# Patient Record
Sex: Male | Born: 1945 | Race: Black or African American | Hispanic: No | Marital: Single | Smoking: Current every day smoker
Health system: Southern US, Community
[De-identification: ages and names within clinical notes are randomized; demographics above are authoritative.]

## PROBLEM LIST (undated history)

## (undated) DIAGNOSIS — K5790 Diverticulosis of intestine, part unspecified, without perforation or abscess without bleeding: Secondary | ICD-10-CM

## (undated) DIAGNOSIS — I10 Essential (primary) hypertension: Secondary | ICD-10-CM

## (undated) DIAGNOSIS — M1990 Unspecified osteoarthritis, unspecified site: Secondary | ICD-10-CM

## (undated) HISTORY — DX: Essential (primary) hypertension: I10

## (undated) HISTORY — PX: COLONOSCOPY: SHX174

---

## 1998-07-02 ENCOUNTER — Encounter: Admission: RE | Admit: 1998-07-02 | Discharge: 1998-07-02 | Payer: Self-pay | Admitting: *Deleted

## 2013-01-09 ENCOUNTER — Telehealth: Payer: Self-pay | Admitting: Internal Medicine

## 2013-01-09 NOTE — Telephone Encounter (Signed)
PT called to see if dr hopper would take him back as a patient since it has been a long time. Also his current insurance is united[employer]-medicare and medicaid. thanks

## 2013-01-09 NOTE — Telephone Encounter (Signed)
Pt notified. Made aware that [facility] was accepting new pt's. Stated he had to check with his insurance and would call back.

## 2013-01-09 NOTE — Telephone Encounter (Signed)
Thank you for considering me as a physician. Unfortunately, [facility] has closed my practice &  I have no option to change this. Other physicians in [place] at various sites are accepting new patients. Until their practices are full; I am not allowed to add new patients.

## 2013-01-09 NOTE — Telephone Encounter (Signed)
Pt states that the last time he had seen Dr. Alwyn Ren was in either 2000 or 2002. Pt states that he went by Kyra Leyland at that time. No record of him in our new system. Please advise.

## 2013-03-28 ENCOUNTER — Encounter: Payer: Self-pay | Admitting: Gastroenterology

## 2013-03-29 ENCOUNTER — Encounter: Payer: Self-pay | Admitting: Gastroenterology

## 2013-05-23 ENCOUNTER — Ambulatory Visit (AMBULATORY_SURGERY_CENTER): Payer: Self-pay | Admitting: *Deleted

## 2013-05-23 VITALS — Ht 68.0 in | Wt 195.0 lb

## 2013-05-23 DIAGNOSIS — Z1211 Encounter for screening for malignant neoplasm of colon: Secondary | ICD-10-CM

## 2013-05-23 MED ORDER — MOVIPREP 100 G PO SOLR: ORAL | Status: DC

## 2013-05-23 NOTE — Progress Notes (Signed)
Patient denies any allergies to eggs or soy. 

## 2013-05-25 ENCOUNTER — Encounter: Payer: Self-pay | Admitting: Gastroenterology

## 2013-06-05 ENCOUNTER — Encounter: Payer: Self-pay | Admitting: Gastroenterology

## 2013-06-07 ENCOUNTER — Encounter: Payer: Self-pay | Admitting: Gastroenterology

## 2013-06-07 ENCOUNTER — Ambulatory Visit (AMBULATORY_SURGERY_CENTER): Payer: 59 | Admitting: Gastroenterology

## 2013-06-07 VITALS — BP 141/84 | HR 73 | Temp 98.1°F | Resp 20 | Ht 68.0 in | Wt 195.0 lb

## 2013-06-07 DIAGNOSIS — Z1211 Encounter for screening for malignant neoplasm of colon: Secondary | ICD-10-CM

## 2013-06-07 DIAGNOSIS — D126 Benign neoplasm of colon, unspecified: Secondary | ICD-10-CM

## 2013-06-07 MED ORDER — SODIUM CHLORIDE 0.9 % IV SOLN: 500.0000 mL | INTRAVENOUS | Status: DC

## 2013-06-07 NOTE — Patient Instructions (Signed)
YOU HAD AN ENDOSCOPIC PROCEDURE TODAY AT THE [facility]: Refer to the procedure report that was given to you for any specific questions about what was found during the examination.  If the procedure report does not answer your questions, please call your gastroenterologist to clarify.  If you requested that your care partner not be given the details of your procedure findings, then the procedure report has been included in a sealed envelope for you to review at your convenience later.  YOU SHOULD EXPECT: Some feelings of bloating in the abdomen. Passage of more gas than usual.  Walking can help get rid of the air that was put into your GI tract during the procedure and reduce the bloating. If you had a lower endoscopy (such as a colonoscopy or flexible sigmoidoscopy) you may notice spotting of blood in your stool or on the toilet paper. If you underwent a bowel prep for your procedure, then you may not have a normal bowel movement for a few days.  DIET: Your first meal following the procedure should be a light meal and then it is ok to progress to your normal diet.  A half-sandwich or bowl of soup is an example of a good first meal.  Heavy or fried foods are harder to digest and may make you feel nauseous or bloated.  Likewise meals heavy in dairy and vegetables can cause extra gas to form and this can also increase the bloating.  Drink plenty of fluids but you should avoid alcoholic beverages for 24 hours.  ACTIVITY: Your care partner should take you home directly after the procedure.  You should plan to take it easy, moving slowly for the rest of the day.  You can resume normal activity the day after the procedure however you should NOT DRIVE or use heavy machinery for 24 hours (because of the sedation medicines used during the test).    SYMPTOMS TO REPORT IMMEDIATELY: A gastroenterologist can be reached at any hour.  During normal business hours, 8:30 AM to 5:00 PM Monday through Friday,  call [phone].  After hours and on weekends, please call the GI answering service at [phone] who will take a message and have the physician on call contact you.   Following lower endoscopy (colonoscopy or flexible sigmoidoscopy):  Excessive amounts of blood in the stool  Significant tenderness or worsening of abdominal pains  Swelling of the abdomen that is new, acute  Fever of 100F or higher    FOLLOW UP: If any biopsies were taken you will be contacted by phone or by letter within the next 1-3 weeks.  Call your gastroenterologist if you have not heard about the biopsies in 3 weeks.  Our staff will call the home number listed on your records the next business day following your procedure to check on you and address any questions or concerns that you may have at that time regarding the information given to you following your procedure. This is a courtesy call and so if there is no answer at the home number and we have not heard from you through the emergency physician on call, we will assume that you have returned to your regular daily activities without incident.  SIGNATURES/CONFIDENTIALITY: You and/or your care partner have signed paperwork which will be entered into your electronic medical record.  These signatures attest to the fact that that the information above on your After Visit Summary has been reviewed and is understood.  Full responsibility of the confidentiality   of this discharge information lies with you and/or your care-partner.    HOLD ASPIRIN AND ANTI INFLAMMATORY PRODUCTS FOR 2 WEEKS   INFORMATION ON POLYPS,DIVERTICULOSIS,HEMORRHOIDS,AND HIGH FIBER DIET GIVEN TO YOU TODAY 

## 2013-06-07 NOTE — Progress Notes (Signed)
Called to room to assist during endoscopic procedure.  Patient ID and intended procedure confirmed with present staff. Received instructions for my participation in the procedure from the performing physician.  

## 2013-06-07 NOTE — Progress Notes (Signed)
Lidocaine-40mg IV prior to Propofol InductionPropofol given over incremental dosages 

## 2013-06-07 NOTE — Progress Notes (Signed)
Patient did not experience any of the following events: a burn prior to discharge; a fall within the facility; wrong site/side/patient/procedure/implant event; or a hospital transfer or hospital admission upon discharge from the facility. (G8907) Patient did not have preoperative order for IV antibiotic SSI prophylaxis. (G8918)  

## 2013-06-07 NOTE — Op Note (Signed)
[facility] [address] [place], [address]   COLONOSCOPY PROCEDURE REPORT PATIENT: Manuel Morales, Manuel Morales  MR#: #[id number] BIRTHDATE: 03-18-1946 , 67  yrs. old GENDER: Male ENDOSCOPIST: Meryl Dare, MD, [facility] REFERRED AO:ZHYQM Concepcion Elk, M.D. PROCEDURE DATE:  06/07/2013 PROCEDURE:   Colonoscopy with snare polypectomy First Screening Colonoscopy - Avg.  risk and is 50 yrs.  old or older Yes.  Prior Negative Screening - Now for repeat screening. N/A  History of Adenoma - Now for follow-up colonoscopy & has been > or = to 3 yrs.  N/A  Polyps Removed Today? Yes. ASA CLASS:   Class II INDICATIONS:average risk screening. MEDICATIONS: MAC sedation, administered by CRNA and propofol (Diprivan) 200mg  IV DESCRIPTION OF PROCEDURE:   After the risks benefits and alternatives of the procedure were thoroughly explained, informed consent was obtained.  A digital rectal exam revealed no abnormalities of the rectum.   The LB [id number]  endoscope was introduced through the anus and advanced to the cecum, which was identified by both the appendix and ileocecal valve. No adverse events experienced.   The quality of the prep was good, using MoviPrep  The instrument was then slowly withdrawn as the colon was fully examined.  COLON FINDINGS: A sessile polyp measuring 7 mm in size was found at the hepatic flexure.  A polypectomy was performed with a cold snare.  The resection was complete and the polyp tissue was completely retrieved.   Two semi-pedunculated polyps measuring 7 mm in size were found in the sigmoid colon.  A polypectomy was performed using snare cautery.  The resection was complete and the polyp tissue was completely retrieved.   Moderate diverticulosis was noted in the descending colon and sigmoid colon.   The colon was otherwise normal.  There was no diverticulosis, inflammation, polyps or cancers unless previously stated.  Retroflexed views revealed internal  hemorrhoids. The time to cecum=1 minutes 58 seconds.  Withdrawal time=11 minutes 00 seconds.  The scope was withdrawn and the procedure completed. COMPLICATIONS: There were no complications. ENDOSCOPIC IMPRESSION: 1.   Sessile polyp measuring 7 mm at the hepatic flexure; polypectomy performed with a cold snare 2.   Two semi-pedunculated polyps measuring 7 mm in the sigmoid colon; polypectomy performed using snare cautery 3.   Moderate diverticulosis was noted in the descending and sigmoid colon 4.   Small internal hemorrhoids RECOMMENDATIONS: 1.  Await pathology results 2.  Hold aspirin, aspirin products, and anti-inflammatory medication for 2 weeks. 3.  High fiber diet with liberal fluid intake. 4.  Repeat colonoscopy in 5 years if polyp(s) adenomatous; otherwise 10 years  eSigned:  Meryl Dare, MD, [facility] 06/07/2013 10:25 AM

## 2013-06-08 ENCOUNTER — Telehealth: Payer: Self-pay

## 2013-06-08 NOTE — Telephone Encounter (Signed)
  Follow up Call-  Call back number 06/07/2013  Post procedure Call Back phone  # [phone]  Permission to leave phone message Yes     Patient questions:  Do you have a fever, pain , or abdominal swelling? no Pain Score  0 *  Have you tolerated food without any problems? yes  Have you been able to return to your normal activities? yes  Do you have any questions about your discharge instructions: Diet   no Medications  no Follow up visit  no  Do you have questions or concerns about your Care? no  Actions: * If pain score is 4 or above: No action needed, pain <4.

## 2013-06-20 ENCOUNTER — Encounter: Payer: Self-pay | Admitting: Gastroenterology

## 2014-08-27 ENCOUNTER — Emergency Department (HOSPITAL_COMMUNITY): Payer: PRIVATE HEALTH INSURANCE

## 2014-08-27 ENCOUNTER — Inpatient Hospital Stay (HOSPITAL_COMMUNITY)
Admission: EM | Admit: 2014-08-27 | Discharge: 2014-09-01 | DRG: 392 | Disposition: A | Discharge status: Home / Self Care | Payer: PRIVATE HEALTH INSURANCE | Attending: Internal Medicine | Admitting: Internal Medicine

## 2014-08-27 ENCOUNTER — Encounter (HOSPITAL_COMMUNITY): Payer: Self-pay | Admitting: Emergency Medicine

## 2014-08-27 DIAGNOSIS — M1990 Unspecified osteoarthritis, unspecified site: Secondary | ICD-10-CM | POA: Diagnosis present

## 2014-08-27 DIAGNOSIS — K635 Polyp of colon: Secondary | ICD-10-CM | POA: Diagnosis present

## 2014-08-27 DIAGNOSIS — Z818 Family history of other mental and behavioral disorders: Secondary | ICD-10-CM | POA: Diagnosis not present

## 2014-08-27 DIAGNOSIS — K5669 Other intestinal obstruction: Secondary | ICD-10-CM

## 2014-08-27 DIAGNOSIS — Z880 Allergy status to penicillin: Secondary | ICD-10-CM

## 2014-08-27 DIAGNOSIS — K5660 Unspecified intestinal obstruction: Secondary | ICD-10-CM | POA: Diagnosis present

## 2014-08-27 DIAGNOSIS — F17200 Nicotine dependence, unspecified, uncomplicated: Secondary | ICD-10-CM | POA: Diagnosis present

## 2014-08-27 DIAGNOSIS — K5732 Diverticulitis of large intestine without perforation or abscess without bleeding: Principal | ICD-10-CM | POA: Insufficient documentation

## 2014-08-27 DIAGNOSIS — K56609 Unspecified intestinal obstruction, unspecified as to partial versus complete obstruction: Secondary | ICD-10-CM

## 2014-08-27 DIAGNOSIS — Z720 Tobacco use: Secondary | ICD-10-CM

## 2014-08-27 DIAGNOSIS — K5792 Diverticulitis of intestine, part unspecified, without perforation or abscess without bleeding: Secondary | ICD-10-CM | POA: Diagnosis present

## 2014-08-27 HISTORY — DX: Diverticulosis of intestine, part unspecified, without perforation or abscess without bleeding: K57.90

## 2014-08-27 HISTORY — DX: Unspecified osteoarthritis, unspecified site: M19.90

## 2014-08-27 LAB — CBC WITH DIFFERENTIAL/PLATELET
Basophils Absolute: 0 10*3/uL (ref 0.0–0.1)
Basophils Relative: 0 % (ref 0–1)
Eosinophils Absolute: 0 10*3/uL (ref 0.0–0.7)
Eosinophils Relative: 0 % (ref 0–5)
HCT: 41.7 % (ref 39.0–52.0)
Hemoglobin: 14.8 g/dL (ref 13.0–17.0)
Lymphocytes Relative: 9 % — ABNORMAL LOW (ref 12–46)
Lymphs Abs: 1.5 10*3/uL (ref 0.7–4.0)
MCH: 34.6 pg — ABNORMAL HIGH (ref 26.0–34.0)
MCHC: 35.5 g/dL (ref 30.0–36.0)
MCV: 97.4 fL (ref 78.0–100.0)
Monocytes Absolute: 0.8 10*3/uL (ref 0.1–1.0)
Monocytes Relative: 5 % (ref 3–12)
Neutro Abs: 15 10*3/uL — ABNORMAL HIGH (ref 1.7–7.7)
Neutrophils Relative %: 86 % — ABNORMAL HIGH (ref 43–77)
Platelets: 228 10*3/uL (ref 150–400)
RBC: 4.28 MIL/uL (ref 4.22–5.81)
RDW: 15 % (ref 11.5–15.5)
WBC: 17.4 10*3/uL — ABNORMAL HIGH (ref 4.0–10.5)

## 2014-08-27 LAB — COMPREHENSIVE METABOLIC PANEL
ALT: 14 U/L (ref 0–53)
AST: 21 U/L (ref 0–37)
Albumin: 3.7 g/dL (ref 3.5–5.2)
Alkaline Phosphatase: 58 U/L (ref 39–117)
Anion gap: 11 (ref 5–15)
BUN: 9 mg/dL (ref 6–23)
CO2: 23 mmol/L (ref 19–32)
Calcium: 9 mg/dL (ref 8.4–10.5)
Chloride: 106 mEq/L (ref 96–112)
Creatinine, Ser: 1.27 mg/dL (ref 0.50–1.35)
GFR calc Af Amer: 66 mL/min — ABNORMAL LOW (ref >90)
GFR calc non Af Amer: 57 mL/min — ABNORMAL LOW (ref >90)
Glucose, Bld: 124 mg/dL — ABNORMAL HIGH (ref 70–99)
Potassium: 4 mmol/L (ref 3.5–5.1)
Sodium: 140 mmol/L (ref 135–145)
Total Bilirubin: 1 mg/dL (ref 0.3–1.2)
Total Protein: 7.1 g/dL (ref 6.0–8.3)

## 2014-08-27 LAB — URINALYSIS, ROUTINE W REFLEX MICROSCOPIC
Glucose, UA: NEGATIVE mg/dL
Hgb urine dipstick: NEGATIVE
Ketones, ur: 15 mg/dL — AB
Nitrite: NEGATIVE
Protein, ur: 30 mg/dL — AB
Specific Gravity, Urine: 1.025 (ref 1.005–1.030)
Urobilinogen, UA: 0.2 mg/dL (ref 0.0–1.0)
pH: 5.5 (ref 5.0–8.0)

## 2014-08-27 LAB — URINE MICROSCOPIC-ADD ON

## 2014-08-27 LAB — I-STAT CG4 LACTIC ACID, ED: Lactic Acid, Venous: 1.52 mmol/L (ref 0.5–2.2)

## 2014-08-27 LAB — LIPASE, BLOOD: Lipase: 20 U/L (ref 11–59)

## 2014-08-27 MED ORDER — CIPROFLOXACIN IN D5W 400 MG/200ML IV SOLN
400.0000 mg | Freq: Once | INTRAVENOUS | Status: AC
  Administered 2014-08-27: 400 mg via INTRAVENOUS
  Filled 2014-08-27: qty 200

## 2014-08-27 MED ORDER — ENOXAPARIN SODIUM 30 MG/0.3ML ~~LOC~~ SOLN
30.0000 mg | SUBCUTANEOUS | Status: DC
  Administered 2014-08-28 – 2014-08-30 (×3): 30 mg via SUBCUTANEOUS
  Filled 2014-08-27 (×4): qty 0.3

## 2014-08-27 MED ORDER — SODIUM CHLORIDE 0.9 % IV SOLN
INTRAVENOUS | Status: DC
  Administered 2014-08-27: 16:00:00 via INTRAVENOUS

## 2014-08-27 MED ORDER — MORPHINE SULFATE 4 MG/ML IJ SOLN
4.0000 mg | Freq: Once | INTRAMUSCULAR | Status: AC
  Administered 2014-08-27: 4 mg via INTRAVENOUS
  Filled 2014-08-27: qty 1

## 2014-08-27 MED ORDER — IOHEXOL 300 MG/ML  SOLN
100.0000 mL | Freq: Once | INTRAMUSCULAR | Status: AC | PRN
  Administered 2014-08-27: 100 mL via INTRAVENOUS

## 2014-08-27 MED ORDER — HYDROCODONE-ACETAMINOPHEN 5-325 MG PO TABS: 1.0000 | ORAL_TABLET | ORAL | Status: DC | PRN

## 2014-08-27 MED ORDER — MORPHINE SULFATE 4 MG/ML IJ SOLN: 4.0000 mg | INTRAMUSCULAR | Status: DC | PRN

## 2014-08-27 MED ORDER — HYDROMORPHONE HCL 1 MG/ML IJ SOLN: 1.0000 mg | INTRAMUSCULAR | Status: DC | PRN

## 2014-08-27 MED ORDER — ACETAMINOPHEN 325 MG PO TABS
650.0000 mg | ORAL_TABLET | Freq: Four times a day (QID) | ORAL | Status: DC | PRN
  Administered 2014-08-27 – 2014-08-28 (×2): 650 mg via ORAL
  Filled 2014-08-27 (×2): qty 2

## 2014-08-27 MED ORDER — SODIUM CHLORIDE 0.9 % IV BOLUS (SEPSIS)
1000.0000 mL | Freq: Once | INTRAVENOUS | Status: AC
  Administered 2014-08-27: 1000 mL via INTRAVENOUS

## 2014-08-27 MED ORDER — ONDANSETRON HCL 4 MG/2ML IJ SOLN: 4.0000 mg | Freq: Four times a day (QID) | INTRAMUSCULAR | Status: DC | PRN

## 2014-08-27 MED ORDER — IOHEXOL 300 MG/ML  SOLN
25.0000 mL | Freq: Once | INTRAMUSCULAR | Status: AC | PRN
  Administered 2014-08-27: 25 mL via ORAL

## 2014-08-27 MED ORDER — ACETAMINOPHEN 650 MG RE SUPP: 650.0000 mg | Freq: Four times a day (QID) | RECTAL | Status: DC | PRN

## 2014-08-27 MED ORDER — ENOXAPARIN SODIUM 40 MG/0.4ML ~~LOC~~ SOLN
40.0000 mg | SUBCUTANEOUS | Status: DC
Start: 2014-08-27 — End: 2014-08-27
  Administered 2014-08-27: 40 mg via SUBCUTANEOUS
  Filled 2014-08-27: qty 0.4

## 2014-08-27 MED ORDER — METRONIDAZOLE IN NACL 5-0.79 MG/ML-% IV SOLN
500.0000 mg | Freq: Once | INTRAVENOUS | Status: AC
  Administered 2014-08-27: 500 mg via INTRAVENOUS
  Filled 2014-08-27: qty 100

## 2014-08-27 MED ORDER — ONDANSETRON HCL 4 MG PO TABS: 4.0000 mg | ORAL_TABLET | Freq: Four times a day (QID) | ORAL | Status: DC | PRN

## 2014-08-27 MED ORDER — ONDANSETRON HCL 4 MG/2ML IJ SOLN
4.0000 mg | Freq: Once | INTRAMUSCULAR | Status: AC
  Administered 2014-08-27: 4 mg via INTRAVENOUS
  Filled 2014-08-27: qty 2

## 2014-08-27 MED ORDER — ALBUTEROL SULFATE (2.5 MG/3ML) 0.083% IN NEBU: 2.5000 mg | INHALATION_SOLUTION | RESPIRATORY_TRACT | Status: DC | PRN

## 2014-08-27 MED ORDER — POTASSIUM CHLORIDE IN NACL 20-0.9 MEQ/L-% IV SOLN
INTRAVENOUS | Status: AC
  Administered 2014-08-27 – 2014-08-28 (×2): via INTRAVENOUS
  Filled 2014-08-27 (×3): qty 1000

## 2014-08-27 MED ORDER — CIPROFLOXACIN IN D5W 400 MG/200ML IV SOLN
400.0000 mg | Freq: Two times a day (BID) | INTRAVENOUS | Status: DC
  Administered 2014-08-27 – 2014-08-31 (×8): 400 mg via INTRAVENOUS
  Filled 2014-08-27 (×10): qty 200

## 2014-08-27 MED ORDER — METRONIDAZOLE IN NACL 5-0.79 MG/ML-% IV SOLN
500.0000 mg | Freq: Three times a day (TID) | INTRAVENOUS | Status: DC
  Administered 2014-08-27 – 2014-08-31 (×12): 500 mg via INTRAVENOUS
  Filled 2014-08-27 (×13): qty 100

## 2014-08-27 NOTE — ED Notes (Signed)
Patient transported to CT 

## 2014-08-27 NOTE — H&P (Signed)
History and Physical  Wadell Craddock HUD:[id number] DOB: 12/25/1945 DOA: 08/27/2014  Referring physician: Dalia Heading, ED-PA PCP: Philis Fendt, MD  Outpatient Specialists:  1. Not known  Chief Complaint: Abdominal pain.  HPI: Manuel Morales is a 68 y.o. male with history of arthritis and no other significant past medical history, underwent colonoscopy by Dr. Lucio Edward in August 2015 which apparently showed a benign polyp and diverticulosis. He was in his usual state of health until 2-3 days ago and started experiencing left lower quadrant abdominal pain after eating some peanuts. He took a laxative with good BM but continued to feel bloated. The pain then gradually got worse. He rates the pain as 3-4/10 in severity, nonradiating, made worse with movement, not associated with nausea, vomiting or chills. He states that he had a fever off 102F at home. Had a normal BM today. Due to persisting symptoms, he presented to the ED where CT abdomen showed left lower colon Diverticulitis and SBO. Hospitalist admission was requested.  Review of Systems: All systems reviewed and apart from history of presenting illness, are negative.  History reviewed. No pertinent past medical history. Past Surgical History  Procedure Laterality Date  . Colonoscopy  ?1999   Social History:  reports that he has been smoking Cigarettes.  He has a 15 pack-year smoking history. He has never used smokeless tobacco. He reports that he does not drink alcohol or use illicit drugs. Single. Patient's mother lives with him. Independent of activities of daily living.  Allergies  Allergen Reactions  . Penicillins Hives    Family History  Problem Relation Age of Onset  . Colon cancer Neg Hx   . Dementia Mother     Prior to Admission medications   Medication Sig Start Date End Date Taking? Authorizing Provider  ibuprofen (ADVIL,MOTRIN) 400 MG tablet Take 400 mg by mouth every 6 (six) hours as needed  for mild pain or moderate pain.   Yes Historical Provider, MD   Physical Exam: Filed Vitals:   08/27/14 1230 08/27/14 1244 08/27/14 1424 08/27/14 1622  BP: 121/67  132/71 116/76  Pulse: 84  79 87  Temp:  100.4 F (38 C) 98.7 F (37.1 C) 100.8 F (38.2 C)  TempSrc:  Rectal Oral Oral  Resp: 21  26 24   Height:    5\' 8"  (1.727 m)  Weight:    39.145 kg (86 lb 4.8 oz)  SpO2: 96%  93% 100%     General exam: Moderately built and nourished pleasant middle-aged male patient, lying comfortably supine on the gurney in no obvious distress.  Head, eyes and ENT: Nontraumatic and normocephalic. Pupils equally reacting to light and accommodation. Oral mucosa moist.  Neck: Supple. No JVD, carotid bruit or thyromegaly.  Lymphatics: No lymphadenopathy.  Respiratory system: Clear to auscultation. No increased work of breathing.  Cardiovascular system: S1 and S2 heard, RRR. No JVD, murmurs, gallops, clicks or pedal edema.  Gastrointestinal system: Abdomen is nondistended, soft. Tenderness in the left lower quadrant with minimal guarding but no rigidity or rebound. Normal bowel sounds heard. No organomegaly or masses appreciated.  Central nervous system: Alert and oriented. No focal neurological deficits.  Extremities: Symmetric 5 x 5 power. Peripheral pulses symmetrically felt.   Skin: No rashes or acute findings.  Musculoskeletal system: Negative exam.  Psychiatry: Pleasant and cooperative.   Labs on Admission:  Basic Metabolic Panel:  Recent Labs Lab 08/27/14 1024  NA 140  K 4.0  CL 106  CO2 23  GLUCOSE 124*  BUN 9  CREATININE 1.27  CALCIUM 9.0   Liver Function Tests:  Recent Labs Lab 08/27/14 1024  AST 21  ALT 14  ALKPHOS 58  BILITOT 1.0  PROT 7.1  ALBUMIN 3.7    Recent Labs Lab 08/27/14 1024  LIPASE 20   No results for input(s): AMMONIA in the last 168 hours. CBC:  Recent Labs Lab 08/27/14 0940  WBC 17.4*  NEUTROABS 15.0*  HGB 14.8  HCT 41.7  MCV  97.4  PLT 228   Cardiac Enzymes: No results for input(s): CKTOTAL, CKMB, CKMBINDEX, TROPONINI in the last 168 hours.  BNP (last 3 results) No results for input(s): PROBNP in the last 8760 hours. CBG: No results for input(s): GLUCAP in the last 168 hours.  Radiological Exams on Admission: Ct Abdomen Pelvis W Contrast  08/27/2014   CLINICAL DATA:  Left abdominal pain with fever  EXAM: CT ABDOMEN AND PELVIS WITH CONTRAST  TECHNIQUE: Multidetector CT imaging of the abdomen and pelvis was performed using the standard protocol following bolus administration of intravenous contrast.  CONTRAST:  163mL OMNIPAQUE IOHEXOL 300 MG/ML  SOLN  COMPARISON:  None.  FINDINGS: Mild dependent atelectasis in the lung bases.  Fatty infiltration of the liver. No focal liver mass lesion. Spleen is normal. Pancreas is normal. Small layering gallstones. Gallbladder wall not thickened.  Kidneys are normal. No renal obstruction, mass, or calculi. Urinary bladder is empty but appears normal.  Diverticular change in the sigmoid colon and lower left colon. Focal area of thickening of the colon with stranding in the pericolonic fat. This is located at the junction of the left colon and sigmoid colon. Adjacent to this loop of colon is a thickened small bowel loop with diffuse mucosal edema. This is felt to be secondarily inflamed from the colonic edema. Diverticulitis appears most likely diagnosis. No abscess. No free air or free fluid.  The jejunum is mildly distended with air-fluid levels proximally suggesting partial small bowel obstruction due to the thickened loop of jejunum.  IMPRESSION: Thickened segment of lower left colon due to diverticulitis. There is stranding in the pericolonic fat as well as a thickened loop of adjacent small bowel felt to be secondarily inflamed in causing mild partial small bowel obstruction. No abscess.  Fatty liver  Cholelithiasis.   Electronically Signed   By: Franchot Gallo M.D.   On: 08/27/2014  13:38    EKG: No EKG seen in Epic.  Assessment/Plan Principal Problem:   Diverticulitis Active Problems:   SBO (small bowel obstruction)   Tobacco use disorder   1. Acute diverticulitis: Admit to medical floor. Bowel rest by nothing by mouth except meds, IV fluids and IV antibiotics-Cipro and Flagyl. Recently had colonoscopy in August 2015. 2. Mild partial small bowel obstruction: Likely secondary to inflamed bowel loops from problem #1. Bowel rest, IV fluids and reevaluate in a.m. with KUB and clinically. 3. Tobacco abuse: Cessation counseled. 4. Leukocytosis: Secondary to problem #1. Follow CBCs     Code Status: Full  Family Communication: None at bedside  Disposition Plan: Home when medically stable.   Time spent: 27 minutes  Ayanah Snader, MD, FACP, FHM. [facility] Pager [phone]  If 7PM-7AM, please contact night-coverage [email or web address] Password [email or web address] 08/27/2014, 5:22 PM

## 2014-08-27 NOTE — ED Notes (Signed)
Pt arrived with brother to ED. Pt stated that he has been having left lower abdominal pain x 2 days. Denies any trouble with bowels and denies blood in stool. Has been having fevers as well as high as 102F.

## 2014-08-27 NOTE — Progress Notes (Signed)
Arrived to 5w04 , ambulated to bed from hallway-gait steady. Denies nausea/pain at this time, instructed on usage of call lights and room surroundings.

## 2014-08-27 NOTE — Progress Notes (Signed)
Dr. Algis Liming notified of arrival to unit

## 2014-08-27 NOTE — ED Provider Notes (Signed)
CSN: [id number]     Arrival date & time 08/27/14  6962 History   First MD Initiated Contact with Patient 08/27/14 [phone]     Chief Complaint  Patient presents with  . Abdominal Pain     (Consider location/radiation/quality/duration/timing/severity/associated sxs/prior Treatment) HPI Patient presents to the emergency department with left-sided abdominal pain that started 2 days ago.  The patient states that she has also had fever associated with the abdominal pain.  The patient denies chest pain, shortness of breath, back pain, dysuria, hematuria, bloody stool, nausea, vomiting, diarrhea, weakness, dizziness, headache, blurred vision, scrotal pain, rash, lightheadedness, near syncope or syncope.  The patient states that he took a pain pill to see if it would relieve his symptoms, but did not other than just putting him to sleep.  Patient states that movement and palpation make the pain worse History reviewed. No pertinent past medical history. Past Surgical History  Procedure Laterality Date  . Colonoscopy  ?1999   Family History  Problem Relation Age of Onset  . Colon cancer Neg Hx   . Dementia Mother    History  Substance Use Topics  . Smoking status: Current Every Day Smoker -- 0.50 packs/day for 30 years    Types: Cigarettes  . Smokeless tobacco: Never Used  . Alcohol Use: No    Review of Systems All other systems negative except as documented in the HPI. All pertinent positives and negatives as reviewed in the HPI.ray    Allergies  Penicillins  Home Medications   Prior to Admission medications   Not on File   BP 125/89 mmHg  Pulse 95  Temp(Src) 100.2 F (37.9 C) (Oral)  Resp 24  SpO2 95% Physical Exam  Constitutional: He is oriented to person, place, and time. He appears well-developed and well-nourished. No distress.  HENT:  Head: Normocephalic and atraumatic.  Mouth/Throat: Oropharynx is clear and moist.  Eyes: Pupils are equal, round, and reactive to light.   Neck: Normal range of motion. Neck supple.  Cardiovascular: Regular rhythm and normal heart sounds.  Tachycardia present.  Exam reveals no gallop and no friction rub.   No murmur heard. Pulmonary/Chest: Effort normal and breath sounds normal. No respiratory distress.  Abdominal: Soft. Normal appearance and bowel sounds are normal. There is tenderness in the left upper quadrant and left lower quadrant. There is guarding. There is no rigidity, no rebound and no CVA tenderness. No hernia.    Musculoskeletal: He exhibits no edema.  Neurological: He is alert and oriented to person, place, and time. He exhibits normal muscle tone. Coordination normal.  Skin: Skin is warm and dry. No rash noted. No erythema.  Psychiatric: He has a normal mood and affect. His behavior is normal.  Nursing note and vitals reviewed.   ED Course  Procedures (including critical care time) Labs Review Labs Reviewed  URINALYSIS, ROUTINE W REFLEX MICROSCOPIC - Abnormal; Notable for the following:    Color, Urine AMBER (*)    APPearance HAZY (*)    Bilirubin Urine SMALL (*)    Ketones, ur 15 (*)    Protein, ur 30 (*)    Leukocytes, UA SMALL (*)    All other components within normal limits  CBC WITH DIFFERENTIAL - Abnormal; Notable for the following:    WBC 17.4 (*)    MCH 34.6 (*)    Neutrophils Relative % 86 (*)    Neutro Abs 15.0 (*)    Lymphocytes Relative 9 (*)    All other components  within normal limits  COMPREHENSIVE METABOLIC PANEL - Abnormal; Notable for the following:    Glucose, Bld 124 (*)    GFR calc non Af Amer 57 (*)    GFR calc Af Amer 66 (*)    All other components within normal limits  URINE MICROSCOPIC-ADD ON - Abnormal; Notable for the following:    Squamous Epithelial / LPF FEW (*)    Bacteria, UA FEW (*)    Casts HYALINE CASTS (*)    All other components within normal limits  LIPASE, BLOOD  I-STAT CG4 LACTIC ACID, ED    Imaging Review Ct Abdomen Pelvis W Contrast  08/27/2014    CLINICAL DATA:  Left abdominal pain with fever  EXAM: CT ABDOMEN AND PELVIS WITH CONTRAST  TECHNIQUE: Multidetector CT imaging of the abdomen and pelvis was performed using the standard protocol following bolus administration of intravenous contrast.  CONTRAST:  156mL OMNIPAQUE IOHEXOL 300 MG/ML  SOLN  COMPARISON:  None.  FINDINGS: Mild dependent atelectasis in the lung bases.  Fatty infiltration of the liver. No focal liver mass lesion. Spleen is normal. Pancreas is normal. Small layering gallstones. Gallbladder wall not thickened.  Kidneys are normal. No renal obstruction, mass, or calculi. Urinary bladder is empty but appears normal.  Diverticular change in the sigmoid colon and lower left colon. Focal area of thickening of the colon with stranding in the pericolonic fat. This is located at the junction of the left colon and sigmoid colon. Adjacent to this loop of colon is a thickened small bowel loop with diffuse mucosal edema. This is felt to be secondarily inflamed from the colonic edema. Diverticulitis appears most likely diagnosis. No abscess. No free air or free fluid.  The jejunum is mildly distended with air-fluid levels proximally suggesting partial small bowel obstruction due to the thickened loop of jejunum.  IMPRESSION: Thickened segment of lower left colon due to diverticulitis. There is stranding in the pericolonic fat as well as a thickened loop of adjacent small bowel felt to be secondarily inflamed in causing mild partial small bowel obstruction. No abscess.  Fatty liver  Cholelithiasis.   Electronically Signed   By: Franchot Gallo M.D.   On: 08/27/2014 13:38   9:15 AM Patient is advised of the expected course here in the emergency department and the testing we will be ordered.  I also advised him that he cannot have anything to eat or drink until after her scan results and our blood tests are resulted.  11 AM I rechecked the patient and he is feeling improved pain at this time.  He is  awaiting further test results and CT scan and he is made aware of this. QUESTION were answered   2:15 PM patient is advised of the CT scan results and he will be admitted to the hospital for further evaluation and care MDM   Final diagnoses:  Diverticulitis of large intestine without perforation or abscess without bleeding        Brent General, PA-C 08/27/14 1501  Shaune Pollack, MD 09/02/14 1150

## 2014-08-28 ENCOUNTER — Inpatient Hospital Stay (HOSPITAL_COMMUNITY): Payer: PRIVATE HEALTH INSURANCE

## 2014-08-28 DIAGNOSIS — D72829 Elevated white blood cell count, unspecified: Secondary | ICD-10-CM

## 2014-08-28 DIAGNOSIS — K5732 Diverticulitis of large intestine without perforation or abscess without bleeding: Principal | ICD-10-CM

## 2014-08-28 LAB — BASIC METABOLIC PANEL
ANION GAP: 10 (ref 5–15)
BUN: 8 mg/dL (ref 6–23)
CHLORIDE: 103 meq/L (ref 96–112)
CO2: 22 mmol/L (ref 19–32)
CREATININE: 1.05 mg/dL (ref 0.50–1.35)
Calcium: 8.5 mg/dL (ref 8.4–10.5)
GFR calc Af Amer: 83 mL/min — ABNORMAL LOW (ref >90)
GFR calc non Af Amer: 72 mL/min — ABNORMAL LOW (ref >90)
Glucose, Bld: 110 mg/dL — ABNORMAL HIGH (ref 70–99)
Potassium: 4 mmol/L (ref 3.5–5.1)
Sodium: 135 mmol/L (ref 135–145)

## 2014-08-28 LAB — CBC
HEMATOCRIT: 38.7 % — AB (ref 39.0–52.0)
HEMOGLOBIN: 13.4 g/dL (ref 13.0–17.0)
MCH: 33.8 pg (ref 26.0–34.0)
MCHC: 34.6 g/dL (ref 30.0–36.0)
MCV: 97.7 fL (ref 78.0–100.0)
Platelets: 218 10*3/uL (ref 150–400)
RBC: 3.96 MIL/uL — AB (ref 4.22–5.81)
RDW: 15.1 % (ref 11.5–15.5)
WBC: 11.6 10*3/uL — ABNORMAL HIGH (ref 4.0–10.5)

## 2014-08-28 NOTE — Progress Notes (Signed)
Utilization review completed. Nikhita Mentzel, RN, BSN. 

## 2014-08-28 NOTE — Progress Notes (Signed)
PROGRESS NOTE    Manuel Morales WUJ:[id number] DOB: 04-04-1946 DOA: 08/27/2014 PCP: Philis Fendt, MD  HPI/Brief narrative 68 year old male with no significant past medical history, admitted with left lower colon Acute diverticulitis and PSBO. Recently underwent colonoscopy in August 2015 which apparently showed benign polyp and diverticulosis.  Assessment/Plan:   1. Acute diverticulitis: Bowel rest by nothing by mouth except meds, IV fluids and IV antibiotics-Cipro and Flagyl. Recently had colonoscopy in August 2015. Improving. 2. Mild partial small bowel obstruction: Likely secondary to inflamed bowel loops from problem #1. Bowel rest, IV fluids and reevaluate in a.m. with KUB and clinically. KUB shows persistent ileus versus PSBO. Continue nothing by mouth for now. Will mobilize. 3. Tobacco abuse: Cessation counseled. 4. Leukocytosis: Secondary to problem #1. Follow CBCs. Improving   Code Status: Full Family Communication: None at bedside Disposition Plan: Home when medically stable   Consultants:  None  Procedures:  None  Antibiotics:  IV Cipro 12/22 >  IV Flagyl 12/22 >   Subjective: Feels better. Left lower quadrant pain almost resolved just feels sore. Passing flatus. No BM. No nausea or vomiting.  Objective: Filed Vitals:   08/27/14 1424 08/27/14 1622 08/27/14 2103 08/28/14 0544  BP: 132/71 116/76 111/75 101/62  Pulse: 79 87 96 85  Temp: 98.7 F (37.1 C) 100.8 F (38.2 C) 98.4 F (36.9 C) 99.3 F (37.4 C)  TempSrc: Oral Oral Oral Oral  Resp: 26 24 21 22   Height:  5\' 8"  (1.727 m)  5\' 8"  (1.727 m)  Weight:    87.227 kg (192 lb 4.8 oz)  SpO2: 93% 100% 100% 94%    Intake/Output Summary (Last 24 hours) at 08/28/14 1159 Last data filed at 08/28/14 0547  Gross per 24 hour  Intake      0 ml  Output    375 ml  Net   -375 ml   Filed Weights   08/28/14 0544  Weight: 87.227 kg (192 lb 4.8 oz)     Exam:  General exam: Pleasant middle-aged  male lying comfortably in bed. Respiratory system: Clear. No increased work of breathing. Cardiovascular system: S1 & S2 heard, RRR. No JVD, murmurs, gallops, clicks or pedal edema. Gastrointestinal system: Abdomen is nondistended, soft. Mild left lower quadrant tenderness to deep palpation without peritoneal signs. Normal bowel sounds heard. Central nervous system: Alert and oriented. No focal neurological deficits. Extremities: Symmetric 5 x 5 power.   Data Reviewed: Basic Metabolic Panel:  Recent Labs Lab 08/27/14 1024 08/28/14 0635  NA 140 135  K 4.0 4.0  CL 106 103  CO2 23 22  GLUCOSE 124* 110*  BUN 9 8  CREATININE 1.27 1.05  CALCIUM 9.0 8.5   Liver Function Tests:  Recent Labs Lab 08/27/14 1024  AST 21  ALT 14  ALKPHOS 58  BILITOT 1.0  PROT 7.1  ALBUMIN 3.7    Recent Labs Lab 08/27/14 1024  LIPASE 20   No results for input(s): AMMONIA in the last 168 hours. CBC:  Recent Labs Lab 08/27/14 0940 08/28/14 0635  WBC 17.4* 11.6*  NEUTROABS 15.0*  --   HGB 14.8 13.4  HCT 41.7 38.7*  MCV 97.4 97.7  PLT 228 218   Cardiac Enzymes: No results for input(s): CKTOTAL, CKMB, CKMBINDEX, TROPONINI in the last 168 hours. BNP (last 3 results) No results for input(s): PROBNP in the last 8760 hours. CBG: No results for input(s): GLUCAP in the last 168 hours.  No results found for this or any previous visit (  from the past 240 hour(s)).         Studies: Abd 1 View (kub)  08/28/2014   CLINICAL DATA:  Bowel obstruction  EXAM: ABDOMEN - 1 VIEW  COMPARISON:  CT abdomen and pelvis August 27, 2014  FINDINGS: There is contrast in the colon. There remain multiple loops of dilated small bowel. No free air is seen on this supine examination.  IMPRESSION: Dilated loops of small bowel. Question ileus versus a degree of partial obstruction. Contrast does reach the colon. No free air.   Electronically Signed   By: Lowella Grip M.D.   On: 08/28/2014 08:10   Ct  Abdomen Pelvis W Contrast  08/27/2014   CLINICAL DATA:  Left abdominal pain with fever  EXAM: CT ABDOMEN AND PELVIS WITH CONTRAST  TECHNIQUE: Multidetector CT imaging of the abdomen and pelvis was performed using the standard protocol following bolus administration of intravenous contrast.  CONTRAST:  142mL OMNIPAQUE IOHEXOL 300 MG/ML  SOLN  COMPARISON:  None.  FINDINGS: Mild dependent atelectasis in the lung bases.  Fatty infiltration of the liver. No focal liver mass lesion. Spleen is normal. Pancreas is normal. Small layering gallstones. Gallbladder wall not thickened.  Kidneys are normal. No renal obstruction, mass, or calculi. Urinary bladder is empty but appears normal.  Diverticular change in the sigmoid colon and lower left colon. Focal area of thickening of the colon with stranding in the pericolonic fat. This is located at the junction of the left colon and sigmoid colon. Adjacent to this loop of colon is a thickened small bowel loop with diffuse mucosal edema. This is felt to be secondarily inflamed from the colonic edema. Diverticulitis appears most likely diagnosis. No abscess. No free air or free fluid.  The jejunum is mildly distended with air-fluid levels proximally suggesting partial small bowel obstruction due to the thickened loop of jejunum.  IMPRESSION: Thickened segment of lower left colon due to diverticulitis. There is stranding in the pericolonic fat as well as a thickened loop of adjacent small bowel felt to be secondarily inflamed in causing mild partial small bowel obstruction. No abscess.  Fatty liver  Cholelithiasis.   Electronically Signed   By: Franchot Gallo M.D.   On: 08/27/2014 13:38        Scheduled Meds: . ciprofloxacin  400 mg Intravenous BID  . enoxaparin (LOVENOX) injection  30 mg Subcutaneous Q24H  . metronidazole  500 mg Intravenous Q8H   Continuous Infusions:   Principal Problem:   Diverticulitis Active Problems:   SBO (small bowel obstruction)   Tobacco  use disorder    Time spent: 34 minutes    Abbigale Mcelhaney, MD, FACP, FHM. [facility] Pager [phone]  If 7PM-7AM, please contact night-coverage [email or web address] Password [email or web address] 08/28/2014, 11:59 AM    LOS: 1 day

## 2014-08-28 NOTE — Progress Notes (Signed)
ARRIVED TO ROOM 5W03 FROM 3s16, ORIENTED TO ROOM AND SURROUNDINGS, DENIES PAIN/NAUSEA AT THIS TIME.

## 2014-08-29 ENCOUNTER — Inpatient Hospital Stay (HOSPITAL_COMMUNITY): Payer: PRIVATE HEALTH INSURANCE

## 2014-08-29 DIAGNOSIS — K5732 Diverticulitis of large intestine without perforation or abscess without bleeding: Secondary | ICD-10-CM | POA: Insufficient documentation

## 2014-08-29 NOTE — Progress Notes (Signed)
PROGRESS NOTE    Manuel Morales TGG:[id number] DOB: [birth year] DOA: 08/27/2014 PCP: Philis Fendt, MD  HPI/Brief narrative 68 year old male with no significant past medical history, admitted with left lower colon Acute diverticulitis and PSBO. Recently underwent colonoscopy in August 2015 which apparently showed benign polyp and diverticulosis.  Assessment/Plan:   1. Acute diverticulitis: Bowel rest by nothing by mouth except meds, IV fluids and IV antibiotics-Cipro and Flagyl. Recently had colonoscopy in August 2015. Improving. Advance to clear liquid diet and tolerating. 2. Mild partial small bowel obstruction: Likely secondary to inflamed bowel loops from problem #1. Treated conservatively with bowel rest and IV fluids. Patient was started on clear liquids 12/23 PM. He has had several watery BM's. KUB shows persisting SBO. Surgery consultation appreciated and indicate that this is due to adjacent small bowel loop that was inflamed and recommend continued clear liquids for now.  3. Tobacco abuse: Cessation counseled. 4. Leukocytosis: Secondary to problem #1. Follow CBCs. Improving   Code Status: Full Family Communication: None at bedside Disposition Plan: Home when medically stable   Consultants:  General surgery  Procedures:  None  Antibiotics:  IV Cipro 12/22 >  IV Flagyl 12/22 >   Subjective: Patient had several watery BMs since last night. Minimal LLQ soreness. Tolerating clear liquid diet.  Objective: Filed Vitals:   08/28/14 1409 08/28/14 2236 08/29/14 0617 08/29/14 1255  BP:  155/84 144/88 153/91  Pulse:  93 84 80  Temp: 100.5 F (38.1 C) 99.5 F (37.5 C) 99.9 F (37.7 C) 98.5 F (36.9 C)  TempSrc: Oral Oral Oral Oral  Resp:  20 15 18   Height:      Weight:   87 kg (191 lb 12.8 oz)   SpO2:  99% 97% 98%    Intake/Output Summary (Last 24 hours) at 08/29/14 1528 Last data filed at 08/29/14 1400  Gross per 24 hour  Intake    500 ml  Output       3 ml  Net    497 ml   Filed Weights   08/28/14 0544 08/29/14 0617  Weight: 87.227 kg (192 lb 4.8 oz) 87 kg (191 lb 12.8 oz)     Exam:  General exam: Pleasant middle-aged male lying comfortably in bed. Respiratory system: Clear. No increased work of breathing. Cardiovascular system: S1 & S2 heard, RRR. No JVD, murmurs, gallops, clicks or pedal edema. Gastrointestinal system: Abdomen is nondistended, soft. Mild left lower quadrant tenderness to deep palpation without peritoneal signs. Normal bowel sounds heard. Central nervous system: Alert and oriented. No focal neurological deficits. Extremities: Symmetric 5 x 5 power.   Data Reviewed: Basic Metabolic Panel:  Recent Labs Lab 08/27/14 1024 08/28/14 0635  NA 140 135  K 4.0 4.0  CL 106 103  CO2 23 22  GLUCOSE 124* 110*  BUN 9 8  CREATININE 1.27 1.05  CALCIUM 9.0 8.5   Liver Function Tests:  Recent Labs Lab 08/27/14 1024  AST 21  ALT 14  ALKPHOS 58  BILITOT 1.0  PROT 7.1  ALBUMIN 3.7    Recent Labs Lab 08/27/14 1024  LIPASE 20   No results for input(s): AMMONIA in the last 168 hours. CBC:  Recent Labs Lab 08/27/14 0940 08/28/14 0635  WBC 17.4* 11.6*  NEUTROABS 15.0*  --   HGB 14.8 13.4  HCT 41.7 38.7*  MCV 97.4 97.7  PLT 228 218   Cardiac Enzymes: No results for input(s): CKTOTAL, CKMB, CKMBINDEX, TROPONINI in the last 168 hours. BNP (last 3  results) No results for input(s): PROBNP in the last 8760 hours. CBG: No results for input(s): GLUCAP in the last 168 hours.  No results found for this or any previous visit (from the past 240 hour(s)).         Studies: Abd 1 View (kub)  08/28/2014   CLINICAL DATA:  Bowel obstruction  EXAM: ABDOMEN - 1 VIEW  COMPARISON:  CT abdomen and pelvis August 27, 2014  FINDINGS: There is contrast in the colon. There remain multiple loops of dilated small bowel. No free air is seen on this supine examination.  IMPRESSION: Dilated loops of small bowel.  Question ileus versus a degree of partial obstruction. Contrast does reach the colon. No free air.   Electronically Signed   By: Lowella Grip M.D.   On: 08/28/2014 08:10   Dg Abd 2 Views  08/29/2014   CLINICAL DATA:  Bowel obstruction.  Pain for 2 days  EXAM: ABDOMEN - 2 VIEW  COMPARISON:  August 28, 2014  FINDINGS: Supine and upright images were obtained. Contrast is noted in the colon. There remain multiple loops of dilated small bowel with multiple air-fluid levels. No free air. No abnormal calcifications.  IMPRESSION: Dilated is loops of small bowel with multiple air-fluid levels. Question ileus versus a degree of partial bowel obstruction. No free air.   Electronically Signed   By: Lowella Grip M.D.   On: 08/29/2014 09:56        Scheduled Meds: . ciprofloxacin  400 mg Intravenous BID  . enoxaparin (LOVENOX) injection  30 mg Subcutaneous Q24H  . metronidazole  500 mg Intravenous Q8H   Continuous Infusions:   Principal Problem:   Diverticulitis Active Problems:   SBO (small bowel obstruction)   Tobacco use disorder    Time spent: 67 minutes    Ilean Spradlin, MD, FACP, FHM. [facility] Pager [phone]  If 7PM-7AM, please contact night-coverage [email or web address] Password [email or web address] 08/29/2014, 3:28 PM    LOS: 2 days

## 2014-08-29 NOTE — Consult Note (Signed)
Reason for Consult:diverticulitis Referring Physician: Dr Francis Gaines is an 68 y.o. male.  HPI: 68 year old African-American male otherwise healthy admitted 2 days ago for diverticulitis. He states he started having pain on Tuesday. He denies any similar symptoms in the past. However he states that he may have had something perhaps that was diverticulitis but he attributed to indigestion. He states that he developed severe abdominal pain on Tuesday. It was mainly in his left lower abdomen. He tried taking a laxative which did make him have a bowel movement but it didn't cause any significant decrease in his pain. Because the pain was intense he came to the emergency room and was found to have an elevated white blood cell count 17,000 and CT evidence of focal diverticulitis with adjacent small loop of small bowel which appear thickened. He denies any weight loss. He denies any melena or hematochezia. He denies any prior surgical intervention. He does smoke about 7 cigarettes a day. He denies any alcohol or drug.  Since admission he states his abdominal pain has gotten aggressively better. He doesn't really have any right now. He reports flatus. He had 4 bowel movements yesterday and one already today. He states when they gave him clear liquids he just felt some indigestion.  Past Medical History  Diagnosis Date  . Arthritis   . Diverticulosis     Past Surgical History  Procedure Laterality Date  . Colonoscopy  ?1999    Family History  Problem Relation Age of Onset  . Colon cancer Neg Hx   . Dementia Mother     Social History:  reports that he has been smoking Cigarettes.  He has a 15 pack-year smoking history. He has never used smokeless tobacco. He reports that he does not drink alcohol or use illicit drugs.  Allergies:  Allergies  Allergen Reactions  . Penicillins Hives    Medications: I have reviewed the patient's current medications.  Results for orders placed or  performed during the hospital encounter of 08/27/14 (from the past 48 hour(s))  Urinalysis, Routine w reflex microscopic     Status: Abnormal   Collection Time: 08/27/14 12:50 PM  Result Value Ref Range   Color, Urine AMBER (A) YELLOW    Comment: BIOCHEMICALS MAY BE AFFECTED BY COLOR   APPearance HAZY (A) CLEAR   Specific Gravity, Urine 1.025 1.005 - 1.030   pH 5.5 5.0 - 8.0   Glucose, UA NEGATIVE NEGATIVE mg/dL   Hgb urine dipstick NEGATIVE NEGATIVE   Bilirubin Urine SMALL (A) NEGATIVE   Ketones, ur 15 (A) NEGATIVE mg/dL   Protein, ur 30 (A) NEGATIVE mg/dL   Urobilinogen, UA 0.2 0.0 - 1.0 mg/dL   Nitrite NEGATIVE NEGATIVE   Leukocytes, UA SMALL (A) NEGATIVE  Urine microscopic-add on     Status: Abnormal   Collection Time: 08/27/14 12:50 PM  Result Value Ref Range   Squamous Epithelial / LPF FEW (A) RARE   WBC, UA 7-10 <3 WBC/hpf   Bacteria, UA FEW (A) RARE   Casts HYALINE CASTS (A) NEGATIVE  Basic metabolic panel     Status: Abnormal   Collection Time: 08/28/14  6:35 AM  Result Value Ref Range   Sodium 135 135 - 145 mmol/L    Comment: Please note change in reference range.   Potassium 4.0 3.5 - 5.1 mmol/L    Comment: Please note change in reference range.   Chloride 103 96 - 112 mEq/L   CO2 22 19 - 32 mmol/L  Glucose, Bld 110 (H) 70 - 99 mg/dL   BUN 8 6 - 23 mg/dL   Creatinine, Ser 1.05 0.50 - 1.35 mg/dL   Calcium 8.5 8.4 - 10.5 mg/dL   GFR calc non Af Amer 72 (L) >90 mL/min   GFR calc Af Amer 83 (L) >90 mL/min    Comment: (NOTE) The eGFR has been calculated using the CKD EPI equation. This calculation has not been validated in all clinical situations. eGFR's persistently <90 mL/min signify possible Chronic Kidney Disease.    Anion gap 10 5 - 15  CBC     Status: Abnormal   Collection Time: 08/28/14  6:35 AM  Result Value Ref Range   WBC 11.6 (H) 4.0 - 10.5 K/uL   RBC 3.96 (L) 4.22 - 5.81 MIL/uL   Hemoglobin 13.4 13.0 - 17.0 g/dL   HCT 38.7 (L) 39.0 - 52.0 %    MCV 97.7 78.0 - 100.0 fL   MCH 33.8 26.0 - 34.0 pg   MCHC 34.6 30.0 - 36.0 g/dL   RDW 15.1 11.5 - 15.5 %   Platelets 218 150 - 400 K/uL    Abd 1 View (kub)  08/28/2014   CLINICAL DATA:  Bowel obstruction  EXAM: ABDOMEN - 1 VIEW  COMPARISON:  CT abdomen and pelvis August 27, 2014  FINDINGS: There is contrast in the colon. There remain multiple loops of dilated small bowel. No free air is seen on this supine examination.  IMPRESSION: Dilated loops of small bowel. Question ileus versus a degree of partial obstruction. Contrast does reach the colon. No free air.   Electronically Signed   By: Lowella Grip M.D.   On: 08/28/2014 08:10   Ct Abdomen Pelvis W Contrast  08/27/2014   CLINICAL DATA:  Left abdominal pain with fever  EXAM: CT ABDOMEN AND PELVIS WITH CONTRAST  TECHNIQUE: Multidetector CT imaging of the abdomen and pelvis was performed using the standard protocol following bolus administration of intravenous contrast.  CONTRAST:  130mL OMNIPAQUE IOHEXOL 300 MG/ML  SOLN  COMPARISON:  None.  FINDINGS: Mild dependent atelectasis in the lung bases.  Fatty infiltration of the liver. No focal liver mass lesion. Spleen is normal. Pancreas is normal. Small layering gallstones. Gallbladder wall not thickened.  Kidneys are normal. No renal obstruction, mass, or calculi. Urinary bladder is empty but appears normal.  Diverticular change in the sigmoid colon and lower left colon. Focal area of thickening of the colon with stranding in the pericolonic fat. This is located at the junction of the left colon and sigmoid colon. Adjacent to this loop of colon is a thickened small bowel loop with diffuse mucosal edema. This is felt to be secondarily inflamed from the colonic edema. Diverticulitis appears most likely diagnosis. No abscess. No free air or free fluid.  The jejunum is mildly distended with air-fluid levels proximally suggesting partial small bowel obstruction due to the thickened loop of jejunum.   IMPRESSION: Thickened segment of lower left colon due to diverticulitis. There is stranding in the pericolonic fat as well as a thickened loop of adjacent small bowel felt to be secondarily inflamed in causing mild partial small bowel obstruction. No abscess.  Fatty liver  Cholelithiasis.   Electronically Signed   By: Franchot Gallo M.D.   On: 08/27/2014 13:38   Dg Abd 2 Views  08/29/2014   CLINICAL DATA:  Bowel obstruction.  Pain for 2 days  EXAM: ABDOMEN - 2 VIEW  COMPARISON:  August 28, 2014  FINDINGS:  Supine and upright images were obtained. Contrast is noted in the colon. There remain multiple loops of dilated small bowel with multiple air-fluid levels. No free air. No abnormal calcifications.  IMPRESSION: Dilated is loops of small bowel with multiple air-fluid levels. Question ileus versus a degree of partial bowel obstruction. No free air.   Electronically Signed   By: Lowella Grip M.D.   On: 08/29/2014 09:56    Review of Systems  Constitutional: Negative for fever, chills and weight loss.  Respiratory: Negative for shortness of breath.   Cardiovascular: Negative for chest pain, palpitations, orthopnea and leg swelling.  Gastrointestinal: Positive for nausea and abdominal pain. Negative for vomiting, constipation, blood in stool and melena.  Genitourinary: Negative for dysuria, frequency and hematuria.  Neurological: Negative for tingling, seizures and loss of consciousness.  All other systems reviewed and are negative.  Blood pressure 144/88, pulse 84, temperature 99.9 F (37.7 C), temperature source Oral, resp. rate 15, height $RemoveBe'5\' 8"'bTYbpPWRr$  (1.727 m), weight 191 lb 12.8 oz (87 kg), SpO2 97 %. Physical Exam  Vitals reviewed. Constitutional: He is oriented to person, place, and time. He appears well-developed and well-nourished. No distress.  Not ill appearing; freely rolls around in bed without pain/discomfort.   HENT:  Head: Normocephalic and atraumatic.  Right Ear: External ear  normal.  Left Ear: External ear normal.  Eyes: Conjunctivae are normal. No scleral icterus.  Neck: Normal range of motion. Neck supple. No tracheal deviation present. No thyromegaly present.  Cardiovascular: Normal rate and normal heart sounds.   Respiratory: Effort normal and breath sounds normal. No stridor. No respiratory distress. He has no wheezes.  GI: Soft. There is no rebound and no guarding.  Some mild distension; very mild TTP in LLQ - no rt/guarding/peritonitis  Musculoskeletal: He exhibits no edema or tenderness.  Lymphadenopathy:    He has no cervical adenopathy.  Neurological: He is alert and oriented to person, place, and time. He exhibits normal muscle tone.  Skin: Skin is warm and dry. No rash noted. He is not diaphoretic. No erythema. No pallor.  Psychiatric: He has a normal mood and affect. His behavior is normal. Judgment and thought content normal.    Assessment/Plan: Sigmoid diverticulitis with adjacent loop of Small bowel inflammation Small bowel ileus Tobacco use  I reviewed his CT scan as well as his imaging. His white count is decreasing. He is afebrile. He is not tachycardic. He is not ill appearing. I believe his diverticulitis is clinically improving. I believe the adjacent small bowel loop that was inflamed on his CT scan is probably still normalizing which explains the bowel gas appearance on his plain film today. Nonetheless he is not clinically obstructed. He is having numerous bowel movements. Contrast has reached his transverse colon. So for right now I would continue nonoperative management of his diverticulitis. I would not advance his diet right now I will keep him on clear liquids. Continue IV antibiotics. We will follow  Leighton Ruff. Redmond Pulling, MD, FACS General, Bariatric, & Minimally Invasive Surgery [facility], [place]   [facility] M 08/29/2014, 11:14 AM

## 2014-08-30 ENCOUNTER — Inpatient Hospital Stay (HOSPITAL_COMMUNITY): Payer: PRIVATE HEALTH INSURANCE

## 2014-08-30 LAB — BASIC METABOLIC PANEL
Anion gap: 12 (ref 5–15)
CALCIUM: 9.1 mg/dL (ref 8.4–10.5)
CO2: 21 mmol/L (ref 19–32)
CREATININE: 0.87 mg/dL (ref 0.50–1.35)
Chloride: 104 mEq/L (ref 96–112)
GFR, EST NON AFRICAN AMERICAN: 88 mL/min — AB (ref >90)
Glucose, Bld: 112 mg/dL — ABNORMAL HIGH (ref 70–99)
Potassium: 3.7 mmol/L (ref 3.5–5.1)
Sodium: 137 mmol/L (ref 135–145)

## 2014-08-30 LAB — CBC
HEMATOCRIT: 37.3 % — AB (ref 39.0–52.0)
HEMOGLOBIN: 13.4 g/dL (ref 13.0–17.0)
MCH: 34.1 pg — AB (ref 26.0–34.0)
MCHC: 35.9 g/dL (ref 30.0–36.0)
MCV: 94.9 fL (ref 78.0–100.0)
PLATELETS: 268 10*3/uL (ref 150–400)
RBC: 3.93 MIL/uL — AB (ref 4.22–5.81)
RDW: 14.1 % (ref 11.5–15.5)
WBC: 9 10*3/uL (ref 4.0–10.5)

## 2014-08-30 NOTE — Progress Notes (Addendum)
Subjective: Doing well. Tolerating clear liquids without nausea. Becoming hungry. Had 2 bowel movements yesterday and one at 2 AM this morning. Denies cramps. Minimal pressure discomfort in abdomen. Voiding without difficulty. Afebrile. Heart rate 80-100.  Objective: Vital signs in last 24 hours: Temp:  [98.5 F (36.9 C)-99.9 F (37.7 C)] 99.2 F (37.3 C) (12/25 0526) Pulse Rate:  [80-99] 99 (12/25 0526) Resp:  [15-20] 20 (12/25 0526) BP: (136-153)/(83-91) 136/83 mmHg (12/25 0526) SpO2:  [97 %-98 %] 97 % (12/25 0526) Weight:  [191 lb 12.8 oz (87 kg)] 191 lb 12.8 oz (87 kg) (12/24 0617) Last BM Date: 08/29/14  Intake/Output from previous day: 12/24 0701 - 12/25 0700 In: 2500 [P.O.:800; IV Piggyback:1700] Out: -  Intake/Output this shift: Total I/O In: 300 [IV Piggyback:300] Out: -   General appearance: Alert. Cooperative. No distress. Ambulating independently. Resp: clear to auscultation bilaterally GI: Abdomen soft. Borderline distended. Tender only to deep palpation left side. No hernias or mass.  Lab Results:   Recent Labs  08/27/14 0940 08/28/14 0635  WBC 17.4* 11.6*  HGB 14.8 13.4  HCT 41.7 38.7*  PLT 228 218   BMET  Recent Labs  08/27/14 1024 08/28/14 0635  NA 140 135  K 4.0 4.0  CL 106 103  CO2 23 22  GLUCOSE 124* 110*  BUN 9 8  CREATININE 1.27 1.05  CALCIUM 9.0 8.5   PT/INR No results for input(s): LABPROT, INR in the last 72 hours. ABG No results for input(s): PHART, HCO3 in the last 72 hours.  Invalid input(s): PCO2, PO2  Studies/Results: Abd 1 View (kub)  08/28/2014   CLINICAL DATA:  Bowel obstruction  EXAM: ABDOMEN - 1 VIEW  COMPARISON:  CT abdomen and pelvis August 27, 2014  FINDINGS: There is contrast in the colon. There remain multiple loops of dilated small bowel. No free air is seen on this supine examination.  IMPRESSION: Dilated loops of small bowel. Question ileus versus a degree of partial obstruction. Contrast does reach the  colon. No free air.   Electronically Signed   By: Lowella Grip M.D.   On: 08/28/2014 08:10   Dg Abd 2 Views  08/29/2014   CLINICAL DATA:  Bowel obstruction.  Pain for 2 days  EXAM: ABDOMEN - 2 VIEW  COMPARISON:  August 28, 2014  FINDINGS: Supine and upright images were obtained. Contrast is noted in the colon. There remain multiple loops of dilated small bowel with multiple air-fluid levels. No free air. No abnormal calcifications.  IMPRESSION: Dilated is loops of small bowel with multiple air-fluid levels. Question ileus versus a degree of partial bowel obstruction. No free air.   Electronically Signed   By: Lowella Grip M.D.   On: 08/29/2014 09:56    Anti-infectives: Anti-infectives    Start     Dose/Rate Route Frequency Ordered Stop   08/27/14 2200  ciprofloxacin (CIPRO) IVPB 400 mg     400 mg200 mL/hr over 60 Minutes Intravenous 2 times daily 08/27/14 1717     08/27/14 2200  metroNIDAZOLE (FLAGYL) IVPB 500 mg     500 mg100 mL/hr over 60 Minutes Intravenous Every 8 hours 08/27/14 1717     08/27/14 1400  metroNIDAZOLE (FLAGYL) IVPB 500 mg     500 mg100 mL/hr over 60 Minutes Intravenous  Once 08/27/14 1356 08/27/14 1544   08/27/14 1400  ciprofloxacin (CIPRO) IVPB 400 mg     400 mg200 mL/hr over 60 Minutes Intravenous  Once 08/27/14 1356 08/27/14 1544  Assessment/Plan:  Acute sigmoid diverticulitis without abscess. Adjacent loop of small bowel inflamed. Suspect this is contributing small bowel ileus but doubt mechanical obstruction.   Will advance diet to full liquids but would not advance further. Continue nonoperative management.  continue IV antibiotics.  Tobacco use  DVT prophylaxis. On Lovenox.   LOS: 3 days    Manuel Morales M 08/30/2014

## 2014-08-30 NOTE — Progress Notes (Signed)
PROGRESS NOTE    Khoen Genet FVC:[id number] DOB: 19-Aug-1946 DOA: 08/27/2014 PCP: Philis Fendt, MD  HPI/Brief narrative 68 year old male with no significant past medical history, admitted with left lower colon Acute diverticulitis and PSBO. Recently underwent colonoscopy in August 2015 which apparently showed benign polyp and diverticulosis.  Assessment/Plan:   1. Acute diverticulitis: Bowel rest , IV fluids and IV antibiotics-Cipro and Flagyl. Recently had colonoscopy in August 2015. Improving. Improving. Gradually advancing diet and today to full liquids. Complete total 14 days of antibiotics and outpatient follow-up with PCP/GI. 2. Mild partial small bowel obstruction: Likely secondary to inflamed bowel loops from problem #1. Treated conservatively with bowel rest and IV fluids. Patient was started on clear liquids 12/23 PM. He has had several watery BM's. KUB shows persisting SBO. Surgery consultation appreciated and indicate that this is due to adjacent small bowel loop that was inflamed and have advanced to full liquids today. Monitor clinically. X-rays show little improvement compared to yesterday. 3. Tobacco abuse: Cessation counseled. 4. Leukocytosis: Secondary to problem #1. Resolved.   Code Status: Full Family Communication: None at bedside Disposition Plan: Home when medically stable   Consultants:  General surgery  Procedures:  None  Antibiotics:  IV Cipro 12/22 >  IV Flagyl 12/22 >   Subjective: Patient had 3 watery BMs today. Tolerating clear liquid diet. Denies abdominal pain.  Objective: Filed Vitals:   08/29/14 0617 08/29/14 1255 08/29/14 2120 08/30/14 0526  BP: 144/88 153/91 150/87 136/83  Pulse: 84 80 90 99  Temp: 99.9 F (37.7 C) 98.5 F (36.9 C) 99.4 F (37.4 C) 99.2 F (37.3 C)  TempSrc: Oral Oral Oral Oral  Resp: 15 18 20 20   Height:      Weight: 87 kg (191 lb 12.8 oz)   87 kg (191 lb 12.8 oz)  SpO2: 97% 98% 97% 97%     Intake/Output Summary (Last 24 hours) at 08/30/14 1049 Last data filed at 08/30/14 0918  Gross per 24 hour  Intake   2750 ml  Output      0 ml  Net   2750 ml   Filed Weights   08/28/14 0544 08/29/14 0617 08/30/14 0526  Weight: 87.227 kg (192 lb 4.8 oz) 87 kg (191 lb 12.8 oz) 87 kg (191 lb 12.8 oz)     Exam:  General exam: Pleasant middle-aged male lying comfortably in bed. Respiratory system: Clear. No increased work of breathing. Cardiovascular system: S1 & S2 heard, RRR. No JVD, murmurs, gallops, clicks or pedal edema. Gastrointestinal system: Abdomen is nondistended, soft. Mild left lower quadrant tenderness to deep palpation without peritoneal signs. Normal bowel sounds heard. Central nervous system: Alert and oriented. No focal neurological deficits. Extremities: Symmetric 5 x 5 power.   Data Reviewed: Basic Metabolic Panel:  Recent Labs Lab 08/27/14 1024 08/28/14 0635 08/30/14 0500  NA 140 135 137  K 4.0 4.0 3.7  CL 106 103 104  CO2 23 22 21   GLUCOSE 124* 110* 112*  BUN 9 8 <5*  CREATININE 1.27 1.05 0.87  CALCIUM 9.0 8.5 9.1   Liver Function Tests:  Recent Labs Lab 08/27/14 1024  AST 21  ALT 14  ALKPHOS 58  BILITOT 1.0  PROT 7.1  ALBUMIN 3.7    Recent Labs Lab 08/27/14 1024  LIPASE 20   No results for input(s): AMMONIA in the last 168 hours. CBC:  Recent Labs Lab 08/27/14 0940 08/28/14 0635 08/30/14 0500  WBC 17.4* 11.6* 9.0  NEUTROABS 15.0*  --   --  HGB 14.8 13.4 13.4  HCT 41.7 38.7* 37.3*  MCV 97.4 97.7 94.9  PLT 228 218 268   Cardiac Enzymes: No results for input(s): CKTOTAL, CKMB, CKMBINDEX, TROPONINI in the last 168 hours. BNP (last 3 results) No results for input(s): PROBNP in the last 8760 hours. CBG: No results for input(s): GLUCAP in the last 168 hours.  No results found for this or any previous visit (from the past 240 hour(s)).         Studies: Dg Abd 2 Views  08/30/2014   CLINICAL DATA:  Small bowel  obstruction with abdominal pain.  EXAM: ABDOMEN - 2 VIEW  COMPARISON:  08/29/2014.  FINDINGS: Dilated loops of small bowel with air-fluid levels persist without significant improvement. Narrowed descending colon lumen redemonstrated in this patient with presumed diverticulitis. Normal sized contrast filled RIGHT and transverse colon. Trace accumulation of oral contrast from prior CT in the rectum.  IMPRESSION: Findings consistent with continued partial bowel obstruction, versus small bowel ileus, with most notable distention of small bowel loops. There is little improvement when compared with yesterday's radiographs.   Electronically Signed   By: Rolla Flatten M.D.   On: 08/30/2014 08:03   Dg Abd 2 Views  08/29/2014   CLINICAL DATA:  Bowel obstruction.  Pain for 2 days  EXAM: ABDOMEN - 2 VIEW  COMPARISON:  August 28, 2014  FINDINGS: Supine and upright images were obtained. Contrast is noted in the colon. There remain multiple loops of dilated small bowel with multiple air-fluid levels. No free air. No abnormal calcifications.  IMPRESSION: Dilated is loops of small bowel with multiple air-fluid levels. Question ileus versus a degree of partial bowel obstruction. No free air.   Electronically Signed   By: Lowella Grip M.D.   On: 08/29/2014 09:56        Scheduled Meds: . ciprofloxacin  400 mg Intravenous BID  . enoxaparin (LOVENOX) injection  30 mg Subcutaneous Q24H  . metronidazole  500 mg Intravenous Q8H   Continuous Infusions:   Principal Problem:   Diverticulitis Active Problems:   SBO (small bowel obstruction)   Tobacco use disorder   Diverticulitis of large intestine without perforation or abscess without bleeding    Time spent: 25 minutes    HONGALGI,ANAND, MD, FACP, FHM. [facility] Pager [phone]  If 7PM-7AM, please contact night-coverage [email or web address] Password [email or web address] 08/30/2014, 10:49 AM    LOS: 3 days

## 2014-08-31 LAB — BASIC METABOLIC PANEL
ANION GAP: 14 (ref 5–15)
BUN: <5 mg/dL — ABNORMAL LOW (ref 6–23)
CO2: 20 mmol/L (ref 19–32)
Calcium: 8.7 mg/dL (ref 8.4–10.5)
Chloride: 100 mEq/L (ref 96–112)
Creatinine, Ser: 0.91 mg/dL (ref 0.50–1.35)
GFR calc Af Amer: >90 mL/min (ref >90)
GFR, EST NON AFRICAN AMERICAN: 86 mL/min — AB (ref >90)
GLUCOSE: 99 mg/dL (ref 70–99)
Potassium: 3.5 mmol/L (ref 3.5–5.1)
Sodium: 134 mmol/L — ABNORMAL LOW (ref 135–145)

## 2014-08-31 LAB — CBC
HCT: 36.5 % — ABNORMAL LOW (ref 39.0–52.0)
Hemoglobin: 13.1 g/dL (ref 13.0–17.0)
MCH: 34.1 pg — AB (ref 26.0–34.0)
MCHC: 35.9 g/dL (ref 30.0–36.0)
MCV: 95.1 fL (ref 78.0–100.0)
PLATELETS: 267 10*3/uL (ref 150–400)
RBC: 3.84 MIL/uL — ABNORMAL LOW (ref 4.22–5.81)
RDW: 14.3 % (ref 11.5–15.5)
WBC: 7.7 10*3/uL (ref 4.0–10.5)

## 2014-08-31 MED ORDER — METRONIDAZOLE 500 MG PO TABS
500.0000 mg | ORAL_TABLET | Freq: Three times a day (TID) | ORAL | Status: DC
  Administered 2014-08-31 – 2014-09-01 (×2): 500 mg via ORAL
  Filled 2014-08-31 (×5): qty 1

## 2014-08-31 MED ORDER — POTASSIUM CHLORIDE CRYS ER 20 MEQ PO TBCR
40.0000 meq | EXTENDED_RELEASE_TABLET | Freq: Once | ORAL | Status: AC
  Administered 2014-08-31: 40 meq via ORAL
  Filled 2014-08-31: qty 2

## 2014-08-31 MED ORDER — ENOXAPARIN SODIUM 40 MG/0.4ML ~~LOC~~ SOLN
40.0000 mg | SUBCUTANEOUS | Status: DC
  Administered 2014-08-31: 40 mg via SUBCUTANEOUS
  Filled 2014-08-31 (×2): qty 0.4

## 2014-08-31 MED ORDER — CIPROFLOXACIN HCL 500 MG PO TABS
500.0000 mg | ORAL_TABLET | Freq: Two times a day (BID) | ORAL | Status: DC
  Administered 2014-08-31 – 2014-09-01 (×2): 500 mg via ORAL
  Filled 2014-08-31 (×4): qty 1

## 2014-08-31 NOTE — Progress Notes (Signed)
  Subjective: Doing well. Denies pain. Tolerating full liquids. Had several loose stools. No fever. WBC 7700. Potassium 3.5. Glucose 99.  Objective: Vital signs in last 24 hours: Temp:  [98.3 F (36.8 C)-98.7 F (37.1 C)] 98.3 F (36.8 C) (12/26 0641) Pulse Rate:  [77-93] 93 (12/26 0641) Resp:  [18] 18 (12/26 0641) BP: (138-171)/(86-90) 138/90 mmHg (12/26 0641) SpO2:  [92 %-98 %] 98 % (12/26 0641) Last BM Date: 08/31/14  Intake/Output from previous day: 2023-09-17 0701 - 12/26 0700 In: 1600 [P.O.:900; IV Piggyback:700] Out: -  Intake/Output this shift: Total I/O In: 120 [P.O.:120] Out: -   General appearance: Alert. No distress. Cooperative. Mental status normal GI: soft, non-tender; bowel sounds normal; no masses,  no organomegaly  Lab Results:   Recent Labs  09-16-14 0500 08/31/14 0422  WBC 9.0 7.7  HGB 13.4 13.1  HCT 37.3* 36.5*  PLT 268 267   BMET  Recent Labs  Sep 16, 2014 0500 08/31/14 0422  NA 137 134*  K 3.7 3.5  CL 104 100  CO2 21 20  GLUCOSE 112* 99  BUN <5* <5*  CREATININE 0.87 0.91  CALCIUM 9.1 8.7   PT/INR No results for input(s): LABPROT, INR in the last 72 hours. ABG No results for input(s): PHART, HCO3 in the last 72 hours.  Invalid input(s): PCO2, PO2  Studies/Results: Dg Abd 2 Views  September 16, 2014   CLINICAL DATA:  Small bowel obstruction with abdominal pain.  EXAM: ABDOMEN - 2 VIEW  COMPARISON:  08/29/2014.  FINDINGS: Dilated loops of small bowel with air-fluid levels persist without significant improvement. Narrowed descending colon lumen redemonstrated in this patient with presumed diverticulitis. Normal sized contrast filled RIGHT and transverse colon. Trace accumulation of oral contrast from prior CT in the rectum.  IMPRESSION: Findings consistent with continued partial bowel obstruction, versus small bowel ileus, with most notable distention of small bowel loops. There is little improvement when compared with yesterday's radiographs.    Electronically Signed   By: Rolla Flatten Morales.D.   On: 09-16-2014 08:03    Anti-infectives: Anti-infectives    Start     Dose/Rate Route Frequency Ordered Stop   08/27/14 2200  ciprofloxacin (CIPRO) IVPB 400 mg     400 mg200 mL/hr over 60 Minutes Intravenous 2 times daily 08/27/14 1717     08/27/14 2200  metroNIDAZOLE (FLAGYL) IVPB 500 mg     500 mg100 mL/hr over 60 Minutes Intravenous Every 8 hours 08/27/14 1717     08/27/14 1400  metroNIDAZOLE (FLAGYL) IVPB 500 mg     500 mg100 mL/hr over 60 Minutes Intravenous  Once 08/27/14 1356 08/27/14 1544   08/27/14 1400  ciprofloxacin (CIPRO) IVPB 400 mg     400 mg200 mL/hr over 60 Minutes Intravenous  Once 08/27/14 1356 08/27/14 1544      Assessment/Plan:  Acute sigmoid diverticulitis without abscess. Adjacent loop of small bowel inflamed. Small bowel ileus resolved. No evidence of obstruction clinically. Will advance diet to soft Continue nonoperative management. He will not need surgery unless he has recurrent problems or complications. continue IV antibiotics. Possible discharge in 1-2 days on oral antibiotics  Last colonoscopy 2014. This will not need to be repeated  DVT prophylaxis. On Lovenox.   LOS: 4 days    Manuel Morales 08/31/2014

## 2014-08-31 NOTE — Progress Notes (Signed)
PROGRESS NOTE    Manuel Morales JKD:[id number] DOB: June 08, 1946 DOA: 08/27/2014 PCP: Philis Fendt, MD  HPI/Brief narrative 68 year old male with no significant past medical history, admitted with left lower colon Acute diverticulitis and PSBO. Recently underwent colonoscopy in August 2015 which apparently showed benign polyp and diverticulosis.  Assessment/Plan:   1. Acute diverticulitis: Bowel rest , IV fluids and IV antibiotics-Cipro and Flagyl. Recently had colonoscopy in August 2015. Improving. Gradually advancing diet and today to full liquids. Complete total 14 days of antibiotics and outpatient follow-up with PCP/GI. Will change antibiotics to oral. 2. Mild partial small bowel obstruction: Likely secondary to inflamed bowel loops from problem #1. Treated conservatively with bowel rest and IV fluids. Patient was started on clear liquids 12/23 PM. He has had several watery BM's. KUB shows persisting SBO. Surgery consultation appreciated and indicate that this is due to adjacent small bowel loop that was inflamed and have advanced to soft diet today. Monitor clinically.  3. Tobacco abuse: Cessation counseled. 4. Leukocytosis: Secondary to problem #1. Resolved.   Code Status: Full Family Communication: None at bedside Disposition Plan: Home possibly in the next 1-2 days   Consultants:  General surgery  Procedures:  None  Antibiotics:  Cipro 12/22 >  Flagyl 12/22 >   Subjective: Several watery BMs. No abdominal pain. Tolerating full liquid diet this morning.  Objective: Filed Vitals:   08/30/14 0526 08/30/14 1259 08/30/14 2300 08/31/14 0641  BP: 136/83 171/86 160/86 138/90  Pulse: 99 77 77 93  Temp: 99.2 F (37.3 C) 98.7 F (37.1 C) 98.4 F (36.9 C) 98.3 F (36.8 C)  TempSrc: Oral Oral Oral Axillary  Resp: 20 18 18 18   Height:      Weight: 87 kg (191 lb 12.8 oz)     SpO2: 97% 94% 92% 98%    Intake/Output Summary (Last 24 hours) at 08/31/14  1334 Last data filed at 08/31/14 0926  Gross per 24 hour  Intake   1420 ml  Output      0 ml  Net   1420 ml   Filed Weights   08/28/14 0544 08/29/14 0617 08/30/14 0526  Weight: 87.227 kg (192 lb 4.8 oz) 87 kg (191 lb 12.8 oz) 87 kg (191 lb 12.8 oz)     Exam:  General exam: Pleasant middle-aged male lying comfortably in bed. Respiratory system: Clear. No increased work of breathing. Cardiovascular system: S1 & S2 heard, RRR. No JVD, murmurs, gallops, clicks or pedal edema. Gastrointestinal system: Abdomen is nondistended, soft and nontender. Normal bowel sounds heard. Central nervous system: Alert and oriented. No focal neurological deficits. Extremities: Symmetric 5 x 5 power.   Data Reviewed: Basic Metabolic Panel:  Recent Labs Lab 08/27/14 1024 08/28/14 0635 08/30/14 0500 08/31/14 0422  NA 140 135 137 134*  K 4.0 4.0 3.7 3.5  CL 106 103 104 100  CO2 23 22 21 20   GLUCOSE 124* 110* 112* 99  BUN 9 8 <5* <5*  CREATININE 1.27 1.05 0.87 0.91  CALCIUM 9.0 8.5 9.1 8.7   Liver Function Tests:  Recent Labs Lab 08/27/14 1024  AST 21  ALT 14  ALKPHOS 58  BILITOT 1.0  PROT 7.1  ALBUMIN 3.7    Recent Labs Lab 08/27/14 1024  LIPASE 20   No results for input(s): AMMONIA in the last 168 hours. CBC:  Recent Labs Lab 08/27/14 0940 08/28/14 0635 08/30/14 0500 08/31/14 0422  WBC 17.4* 11.6* 9.0 7.7  NEUTROABS 15.0*  --   --   --  HGB 14.8 13.4 13.4 13.1  HCT 41.7 38.7* 37.3* 36.5*  MCV 97.4 97.7 94.9 95.1  PLT 228 218 268 267   Cardiac Enzymes: No results for input(s): CKTOTAL, CKMB, CKMBINDEX, TROPONINI in the last 168 hours. BNP (last 3 results) No results for input(s): PROBNP in the last 8760 hours. CBG: No results for input(s): GLUCAP in the last 168 hours.  No results found for this or any previous visit (from the past 240 hour(s)).         Studies: Dg Abd 2 Views  08/30/2014   CLINICAL DATA:  Small bowel obstruction with abdominal pain.   EXAM: ABDOMEN - 2 VIEW  COMPARISON:  08/29/2014.  FINDINGS: Dilated loops of small bowel with air-fluid levels persist without significant improvement. Narrowed descending colon lumen redemonstrated in this patient with presumed diverticulitis. Normal sized contrast filled RIGHT and transverse colon. Trace accumulation of oral contrast from prior CT in the rectum.  IMPRESSION: Findings consistent with continued partial bowel obstruction, versus small bowel ileus, with most notable distention of small bowel loops. There is little improvement when compared with yesterday's radiographs.   Electronically Signed   By: Rolla Flatten M.D.   On: 08/30/2014 08:03        Scheduled Meds: . ciprofloxacin  400 mg Intravenous BID  . enoxaparin (LOVENOX) injection  30 mg Subcutaneous Q24H  . metronidazole  500 mg Intravenous Q8H   Continuous Infusions:   Principal Problem:   Diverticulitis Active Problems:   SBO (small bowel obstruction)   Tobacco use disorder   Diverticulitis of large intestine without perforation or abscess without bleeding    Time spent: 25 minutes    Consandra Laske, MD, FACP, FHM. [facility] Pager [phone]  If 7PM-7AM, please contact night-coverage [email or web address] Password [email or web address] 08/31/2014, 1:34 PM    LOS: 4 days

## 2014-09-01 MED ORDER — CIPROFLOXACIN HCL 500 MG PO TABS: 500.0000 mg | ORAL_TABLET | Freq: Two times a day (BID) | ORAL | Status: DC

## 2014-09-01 MED ORDER — METRONIDAZOLE 500 MG PO TABS: 500.0000 mg | ORAL_TABLET | Freq: Three times a day (TID) | ORAL | Status: DC

## 2014-09-01 NOTE — Progress Notes (Signed)
Manuel Morales to be D/C'd Home per MD order.  Discussed with the patient and all questions fully answered.    Medication List    TAKE these medications        ciprofloxacin 500 MG tablet  Commonly known as:  CIPRO  Take 1 tablet (500 mg total) by mouth 2 (two) times daily.     ibuprofen 400 MG tablet  Commonly known as:  ADVIL,MOTRIN  Take 400 mg by mouth every 6 (six) hours as needed for mild pain or moderate pain.     metroNIDAZOLE 500 MG tablet  Commonly known as:  FLAGYL  Take 1 tablet (500 mg total) by mouth every 8 (eight) hours.        VVS, Skin clean, dry and intact without evidence of skin break down, no evidence of skin tears noted. IV catheter discontinued intact. Site without signs and symptoms of complications. Dressing and pressure applied.  An After Visit Summary was printed and given to the patient.  D/c education completed with patient/family including follow up instructions, medication list, d/c activities limitations if indicated, with other d/c instructions as indicated by MD - patient able to verbalize understanding, all questions fully answered.   Patient instructed to return to ED, call 911, or call MD for any changes in condition.   Patient escorted via [place], and D/C home via private auto.  [name] 09/01/2014 12:15 PM

## 2014-09-01 NOTE — Progress Notes (Signed)
  Subjective: Patient is doing well. Essentially asymptomatic. Tolerating regular diet. Having loose stools. Denies abdominal pain or tenderness. Last colonoscopy 2015, Dr. Fuller Plan.  Objective: Vital signs in last 24 hours: Temp:  [98.3 F (36.8 C)-99.3 F (37.4 C)] 98.3 F (36.8 C) (12/27 0525) Pulse Rate:  [74-93] 74 (12/27 0525) Resp:  [18-20] 18 (12/27 0525) BP: (138-149)/(70-90) 149/89 mmHg (12/27 0525) SpO2:  [95 %-98 %] 98 % (12/27 0525) Weight:  [188 lb 11.2 oz (85.594 kg)] 188 lb 11.2 oz (85.594 kg) (12/27 0525) Last BM Date: 08/31/14  Intake/Output from previous day: 12/26 0701 - 12/27 0700 In: 620 [P.O.:320; IV Piggyback:300] Out: -  Intake/Output this shift:    General appearance: Alert. Pleasant. No distress. Mental status normal. GI: soft, non-tender; bowel sounds normal; no masses,  no organomegaly  Lab Results:   Recent Labs  08/30/14 0500 08/31/14 0422  WBC 9.0 7.7  HGB 13.4 13.1  HCT 37.3* 36.5*  PLT 268 267   BMET  Recent Labs  08/30/14 0500 08/31/14 0422  NA 137 134*  K 3.7 3.5  CL 104 100  CO2 21 20  GLUCOSE 112* 99  BUN <5* <5*  CREATININE 0.87 0.91  CALCIUM 9.1 8.7   PT/INR No results for input(s): LABPROT, INR in the last 72 hours. ABG No results for input(s): PHART, HCO3 in the last 72 hours.  Invalid input(s): PCO2, PO2  Studies/Results: No results found.  Anti-infectives: Anti-infectives    Start     Dose/Rate Route Frequency Ordered Stop   08/31/14 2200  metroNIDAZOLE (FLAGYL) tablet 500 mg     500 mg Oral 3 times per day 08/31/14 1339     08/31/14 2200  ciprofloxacin (CIPRO) tablet 500 mg     500 mg Oral 2 times daily 08/31/14 1339     08/27/14 2200  ciprofloxacin (CIPRO) IVPB 400 mg  Status:  Discontinued     400 mg200 mL/hr over 60 Minutes Intravenous 2 times daily 08/27/14 1717 08/31/14 1339   08/27/14 2200  metroNIDAZOLE (FLAGYL) IVPB 500 mg  Status:  Discontinued     500 mg100 mL/hr over 60 Minutes Intravenous  Every 8 hours 08/27/14 1717 08/31/14 1339   08/27/14 1400  metroNIDAZOLE (FLAGYL) IVPB 500 mg     500 mg100 mL/hr over 60 Minutes Intravenous  Once 08/27/14 1356 08/27/14 1544   08/27/14 1400  ciprofloxacin (CIPRO) IVPB 400 mg     400 mg200 mL/hr over 60 Minutes Intravenous  Once 08/27/14 1356 08/27/14 1544      Assessment/Plan:   Acute sigmoid diverticulitis without abscess. Adjacent loop of small bowel inflamed. Small bowel ileus resolved. No evidence of obstruction clinically. Tolerating soft diet  Recommend that patient be discharged and complete a ten-day course of oral Cipro and Flagyl   recommend hydration and eventual high-fiber low-fat diet Recommend that he follow-up with his gastroenterologist, Dr. Fuller Plan for medical management of his uncomplicated diverticulitis He does not need to follow-up with surgery unless he has recurrent symptoms or complications. Surgery will sign off at this time. Please reconsult if surgical problems arise.  Last colonoscopy 2014. This will not need to be repeated  DVT prophylaxis. On Lovenox.   LOS: 5 days    Georgene Kopper M 09/01/2014

## 2014-09-01 NOTE — Discharge Summary (Signed)
Physician Discharge Summary  Manuel Morales LSL:[id number] DOB: September 08, 1945 DOA: 08/27/2014  PCP: Philis Fendt, MD  Admit date: 08/27/2014 Discharge date: 09/01/2014  Time spent: Less than 30 minutes  Recommendations for Outpatient Follow-up:  1. Dr. Nolene Ebbs, PCP in 1 week with repeat labs (CBC & BMP). 2. Dr. Lucio Edward, GI in 2 weeks.  Discharge Diagnoses:  Principal Problem:   Diverticulitis Active Problems:   SBO (small bowel obstruction)   Tobacco use disorder   Diverticulitis of large intestine without perforation or abscess without bleeding   Discharge Condition: Improved & Stable  Diet recommendation: Soft diet  Filed Weights   08/29/14 0617 08/30/14 0526 09/01/14 0525  Weight: 87 kg (191 lb 12.8 oz) 87 kg (191 lb 12.8 oz) 85.594 kg (188 lb 11.2 oz)    History of present illness:  68 year old male with no significant past medical history, admitted with left lower colon Acute diverticulitis and PSBO. Recently underwent colonoscopy on 06/07/14 which apparently showed benign polyps and diverticulosis.  Hospital Course:   Patient was diagnosed with acute diverticulitis without complicating features such as perforation or abscess. He did have partial small bowel obstruction related to adjacent loop of inflamed small bowel. He was treated conservatively with bowel rest, IV fluids and IV antibiotics including Cipro and metronidazole. With these measures even though his symptoms of abdominal pain improved and he was having BMs, x-rays showed persistent partial small bowel obstruction. Surgery was thereby consulted and continued same conservative management. His diet was gradually advanced & he has tolerated soft diet now. His partial small bowel obstruction has clinically resolved. He has no further abdominal pain and is tolerating diet and having BMs. Surgery has seen him today and cleared him for discharge home. Will complete total 10 days of antibiotics for acute  diverticulitis. He will be discharged on soft diet and has been advised to gradually upgraded to high-fiber low-fat diet in a week or 2. His last colonoscopy was done a year ago and this does not need to be repeated. He is however advised to follow-up with his primary gastroenterologist.  Consultations:  General surgery  Procedures:  None    Discharge Exam:  Complaints:  Denies complaints. Tolerating soft diet. Having BMs in the consistency of stools is improving. Denies abdominal pain, nausea or vomiting. No reported fevers.  Filed Vitals:   08/31/14 0641 08/31/14 1359 08/31/14 2158 09/01/14 0525  BP: 138/90 144/70 138/80 149/89  Pulse: 93 80 84 74  Temp: 98.3 F (36.8 C) 99.3 F (37.4 C) 99.3 F (37.4 C) 98.3 F (36.8 C)  TempSrc: Axillary Oral Oral Oral  Resp: 18 18 20 18   Height:      Weight:    85.594 kg (188 lb 11.2 oz)  SpO2: 98% 98% 95% 98%   General exam: Pleasant middle-aged male sitting up comfortably in chair. Respiratory system: Clear. No increased work of breathing. Cardiovascular system: S1 & S2 heard, RRR. No JVD, murmurs, gallops, clicks or pedal edema. Gastrointestinal system: Abdomen is nondistended, soft and nontender. Normal bowel sounds heard. Central nervous system: Alert and oriented. No focal neurological deficits. Extremities: Symmetric 5 x 5 power.  Discharge Instructions      Discharge Instructions    Activity as tolerated - No restrictions    Complete by:  As directed      Call MD for:  severe uncontrolled pain    Complete by:  As directed      Call MD for:  temperature >100.4  Complete by:  As directed      Discharge instructions    Complete by:  As directed   Soft diet.            Medication List    TAKE these medications        ciprofloxacin 500 MG tablet  Commonly known as:  CIPRO  Take 1 tablet (500 mg total) by mouth 2 (two) times daily.     ibuprofen 400 MG tablet  Commonly known as:  ADVIL,MOTRIN  Take 400 mg by  mouth every 6 (six) hours as needed for mild pain or moderate pain.     metroNIDAZOLE 500 MG tablet  Commonly known as:  FLAGYL  Take 1 tablet (500 mg total) by mouth every 8 (eight) hours.       Follow-up Information    Follow up with AVBUERE,EDWIN A, MD. Schedule an appointment as soon as possible for a visit in 1 week.   Specialty:  Internal Medicine   Why:  To be seen with repeat labs (CBC & BMP).   Contact information:   [address] [place] [address] [phone]       Follow up with Norberto Sorenson T. Fuller Plan, MD. Schedule an appointment as soon as possible for a visit in 2 weeks.   Specialty:  Gastroenterology   Contact information:   [address] [address] [phone]        The results of significant diagnostics from this hospitalization (including imaging, microbiology, ancillary and laboratory) are listed below for reference.    Significant Diagnostic Studies: Abd 1 View (kub)  08/28/2014   CLINICAL DATA:  Bowel obstruction  EXAM: ABDOMEN - 1 VIEW  COMPARISON:  CT abdomen and pelvis August 27, 2014  FINDINGS: There is contrast in the colon. There remain multiple loops of dilated small bowel. No free air is seen on this supine examination.  IMPRESSION: Dilated loops of small bowel. Question ileus versus a degree of partial obstruction. Contrast does reach the colon. No free air.   Electronically Signed   By: Lowella Grip M.D.   On: 08/28/2014 08:10   Ct Abdomen Pelvis W Contrast  08/27/2014   CLINICAL DATA:  Left abdominal pain with fever  EXAM: CT ABDOMEN AND PELVIS WITH CONTRAST  TECHNIQUE: Multidetector CT imaging of the abdomen and pelvis was performed using the standard protocol following bolus administration of intravenous contrast.  CONTRAST:  163mL OMNIPAQUE IOHEXOL 300 MG/ML  SOLN  COMPARISON:  None.  FINDINGS: Mild dependent atelectasis in the lung bases.  Fatty infiltration of the liver. No focal liver mass lesion. Spleen is normal.  Pancreas is normal. Small layering gallstones. Gallbladder wall not thickened.  Kidneys are normal. No renal obstruction, mass, or calculi. Urinary bladder is empty but appears normal.  Diverticular change in the sigmoid colon and lower left colon. Focal area of thickening of the colon with stranding in the pericolonic fat. This is located at the junction of the left colon and sigmoid colon. Adjacent to this loop of colon is a thickened small bowel loop with diffuse mucosal edema. This is felt to be secondarily inflamed from the colonic edema. Diverticulitis appears most likely diagnosis. No abscess. No free air or free fluid.  The jejunum is mildly distended with air-fluid levels proximally suggesting partial small bowel obstruction due to the thickened loop of jejunum.  IMPRESSION: Thickened segment of lower left colon due to diverticulitis. There is stranding in the pericolonic fat as well as a  thickened loop of adjacent small bowel felt to be secondarily inflamed in causing mild partial small bowel obstruction. No abscess.  Fatty liver  Cholelithiasis.   Electronically Signed   By: Franchot Gallo M.D.   On: 08/27/2014 13:38   Dg Abd 2 Views  08/30/2014   CLINICAL DATA:  Small bowel obstruction with abdominal pain.  EXAM: ABDOMEN - 2 VIEW  COMPARISON:  08/29/2014.  FINDINGS: Dilated loops of small bowel with air-fluid levels persist without significant improvement. Narrowed descending colon lumen redemonstrated in this patient with presumed diverticulitis. Normal sized contrast filled RIGHT and transverse colon. Trace accumulation of oral contrast from prior CT in the rectum.  IMPRESSION: Findings consistent with continued partial bowel obstruction, versus small bowel ileus, with most notable distention of small bowel loops. There is little improvement when compared with yesterday's radiographs.   Electronically Signed   By: Rolla Flatten M.D.   On: 08/30/2014 08:03   Dg Abd 2 Views  08/29/2014   CLINICAL  DATA:  Bowel obstruction.  Pain for 2 days  EXAM: ABDOMEN - 2 VIEW  COMPARISON:  August 28, 2014  FINDINGS: Supine and upright images were obtained. Contrast is noted in the colon. There remain multiple loops of dilated small bowel with multiple air-fluid levels. No free air. No abnormal calcifications.  IMPRESSION: Dilated is loops of small bowel with multiple air-fluid levels. Question ileus versus a degree of partial bowel obstruction. No free air.   Electronically Signed   By: Lowella Grip M.D.   On: 08/29/2014 09:56    Microbiology: No results found for this or any previous visit (from the past 240 hour(s)).   Labs: Basic Metabolic Panel:  Recent Labs Lab 08/27/14 1024 08/28/14 0635 08/30/14 0500 08/31/14 0422  NA 140 135 137 134*  K 4.0 4.0 3.7 3.5  CL 106 103 104 100  CO2 23 22 21 20   GLUCOSE 124* 110* 112* 99  BUN 9 8 <5* <5*  CREATININE 1.27 1.05 0.87 0.91  CALCIUM 9.0 8.5 9.1 8.7   Liver Function Tests:  Recent Labs Lab 08/27/14 1024  AST 21  ALT 14  ALKPHOS 58  BILITOT 1.0  PROT 7.1  ALBUMIN 3.7    Recent Labs Lab 08/27/14 1024  LIPASE 20   No results for input(s): AMMONIA in the last 168 hours. CBC:  Recent Labs Lab 08/27/14 0940 08/28/14 0635 08/30/14 0500 08/31/14 0422  WBC 17.4* 11.6* 9.0 7.7  NEUTROABS 15.0*  --   --   --   HGB 14.8 13.4 13.4 13.1  HCT 41.7 38.7* 37.3* 36.5*  MCV 97.4 97.7 94.9 95.1  PLT 228 218 268 267   Cardiac Enzymes: No results for input(s): CKTOTAL, CKMB, CKMBINDEX, TROPONINI in the last 168 hours. BNP: BNP (last 3 results) No results for input(s): PROBNP in the last 8760 hours. CBG: No results for input(s): GLUCAP in the last 168 hours.     Signed:  Vernell Leep, MD, FACP, FHM. [facility] Pager [phone]  If 7PM-7AM, please contact night-coverage [email or web address] Password [facility] 09/01/2014, 10:42 AM

## 2014-09-01 NOTE — Discharge Instructions (Signed)

## 2015-03-03 ENCOUNTER — Other Ambulatory Visit: Payer: Self-pay

## 2016-09-12 IMAGING — DX DG ABDOMEN 2V
2 series · 2 of 2 positions shown · non-contrast
Comparison: August 28, 2014

CLINICAL DATA: Bowel obstruction.  Pain for 2 days

EXAM:
ABDOMEN - 2 VIEW

[abdomen erect]
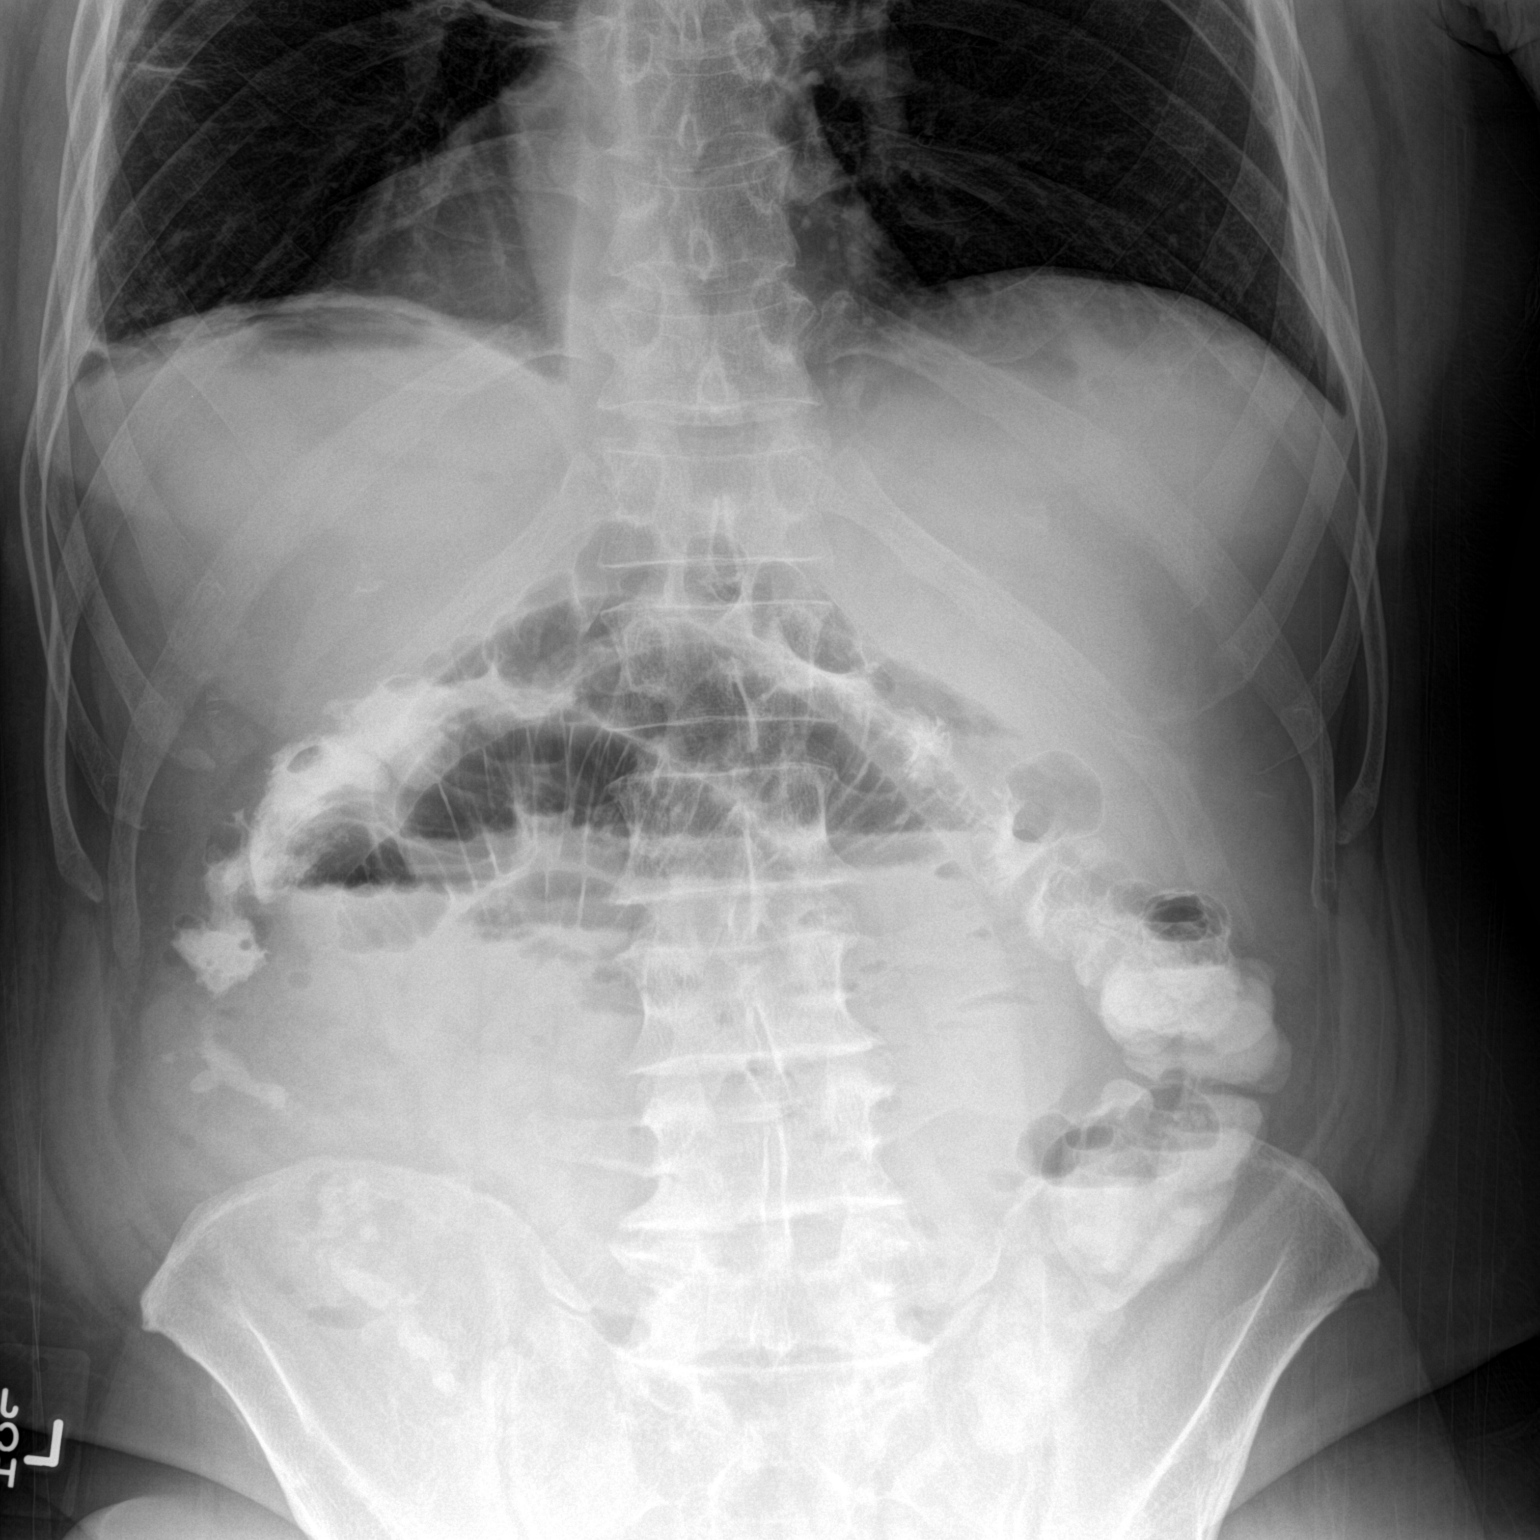

[abdomen supine]
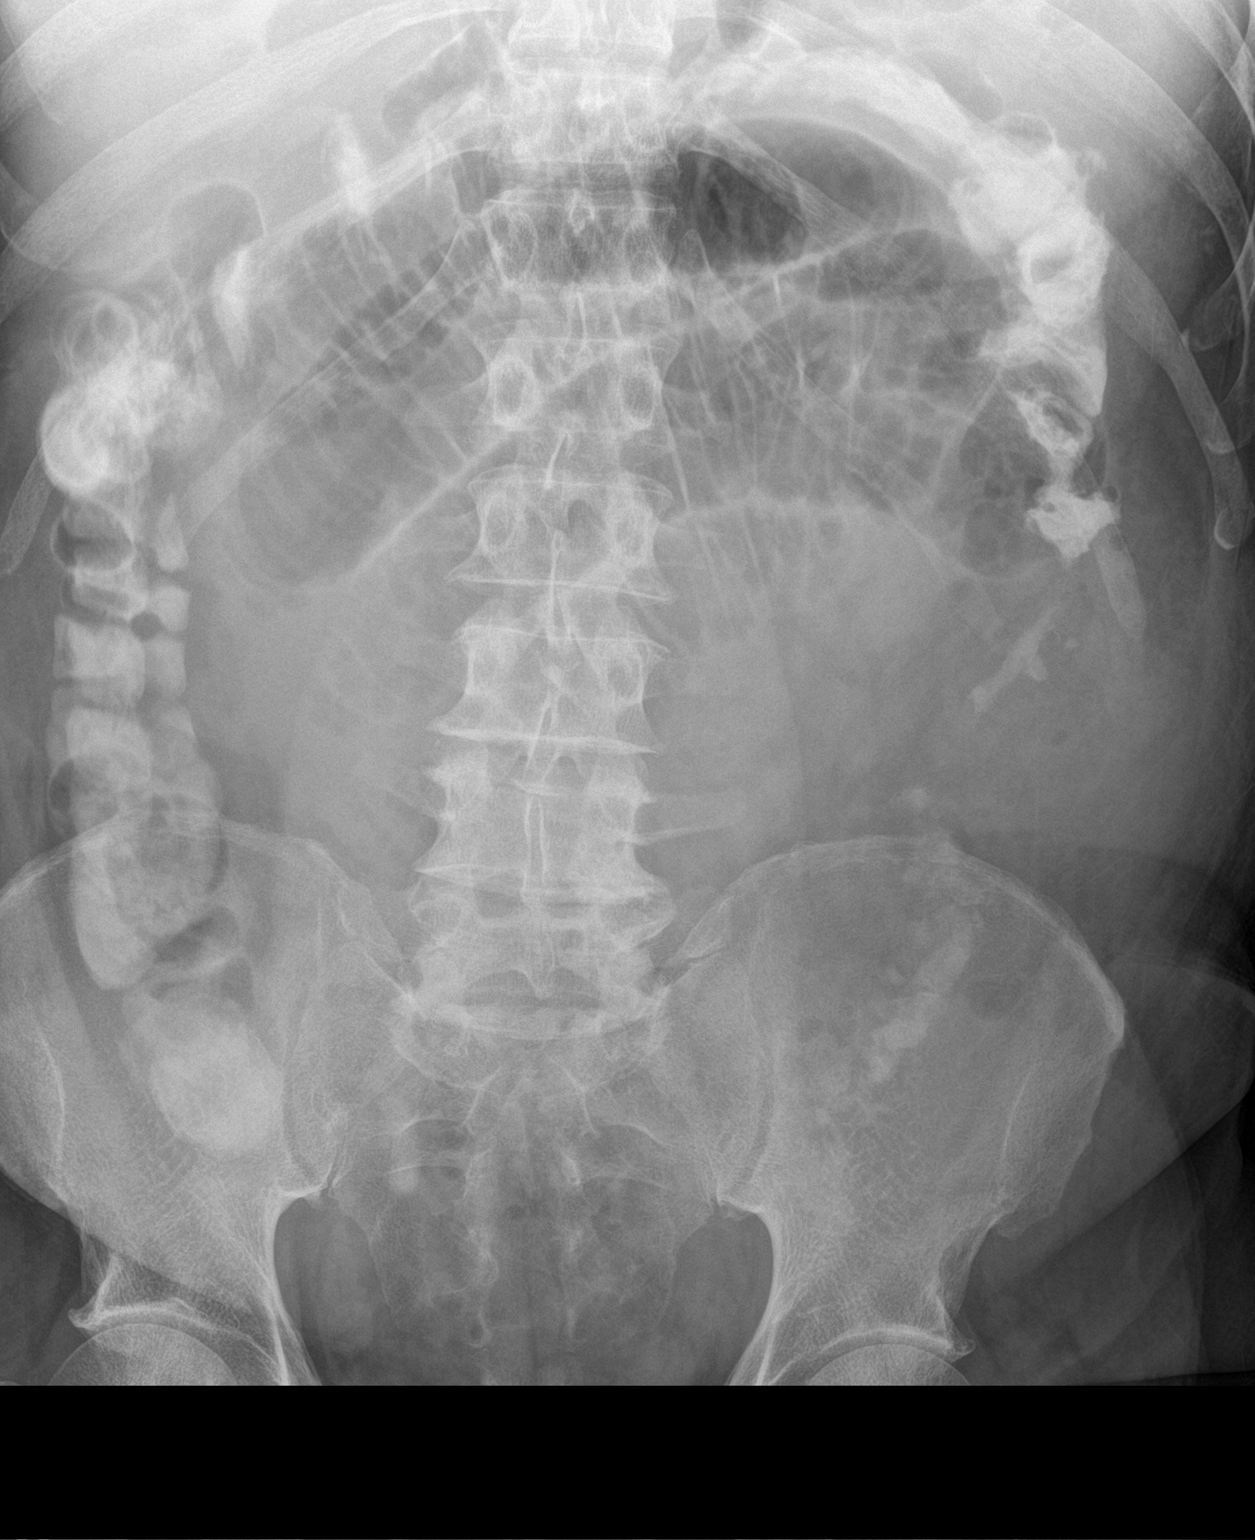

[2 of 2 positions shown; findings below may reference images not displayed]

FINDINGS: Supine and upright images were obtained. Contrast is noted in the
colon. There remain multiple loops of dilated small bowel with
multiple air-fluid levels. No free air. No abnormal calcifications.
IMPRESSION: Dilated is loops of small bowel with multiple air-fluid levels.
Question ileus versus a degree of partial bowel obstruction. No free
air.

## 2018-07-03 ENCOUNTER — Encounter: Payer: Self-pay | Admitting: Gastroenterology

## 2021-02-10 ENCOUNTER — Encounter: Payer: Self-pay | Admitting: Gastroenterology

## 2021-03-31 ENCOUNTER — Ambulatory Visit (AMBULATORY_SURGERY_CENTER): Payer: Medicare Other

## 2021-03-31 ENCOUNTER — Other Ambulatory Visit: Payer: Self-pay

## 2021-03-31 VITALS — Ht 68.0 in | Wt 233.0 lb

## 2021-03-31 DIAGNOSIS — Z86010 Personal history of colonic polyps: Secondary | ICD-10-CM

## 2021-03-31 MED ORDER — PLENVU 140 G PO SOLR: 1.0000 | ORAL | 0 refills | Status: DC

## 2021-03-31 NOTE — Progress Notes (Signed)
Patient's pre-visit was done today over the phone with the patient   Name,DOB and address verified.  Patient denies any allergies to Eggs and Soy. Patient denies any problems with anesthesia/sedation has only had the sedation with colonoscopy. Patient denies taking diet pills or blood thinners. No home Oxygen. Packet of Prep instructions mailed to patient including a copy of a consent form-pt is aware. Patient understands to call us back with any questions or concerns. Patient is aware of our care-partner policy and 0000000 safety protocol.

## 2021-04-14 ENCOUNTER — Encounter: Payer: Self-pay | Admitting: Gastroenterology

## 2021-04-20 ENCOUNTER — Other Ambulatory Visit: Payer: Self-pay | Admitting: General Surgery

## 2021-05-26 ENCOUNTER — Encounter: Payer: Self-pay | Admitting: Gastroenterology

## 2023-12-15 ENCOUNTER — Inpatient Hospital Stay (HOSPITAL_COMMUNITY)
Admission: EM | Admit: 2023-12-15 | Discharge: 2023-12-24 | DRG: 329 | Disposition: A | Discharge status: Home / Self Care | Attending: Family Medicine | Admitting: Family Medicine

## 2023-12-15 ENCOUNTER — Encounter (HOSPITAL_COMMUNITY): Payer: Self-pay | Admitting: Emergency Medicine

## 2023-12-15 ENCOUNTER — Emergency Department (HOSPITAL_COMMUNITY)

## 2023-12-15 ENCOUNTER — Other Ambulatory Visit: Payer: Self-pay

## 2023-12-15 DIAGNOSIS — R1032 Left lower quadrant pain: Principal | ICD-10-CM

## 2023-12-15 DIAGNOSIS — K5900 Constipation, unspecified: Secondary | ICD-10-CM | POA: Diagnosis present

## 2023-12-15 DIAGNOSIS — Z5331 Laparoscopic surgical procedure converted to open procedure: Secondary | ICD-10-CM

## 2023-12-15 DIAGNOSIS — R933 Abnormal findings on diagnostic imaging of other parts of digestive tract: Secondary | ICD-10-CM

## 2023-12-15 DIAGNOSIS — E669 Obesity, unspecified: Secondary | ICD-10-CM | POA: Diagnosis present

## 2023-12-15 DIAGNOSIS — F17210 Nicotine dependence, cigarettes, uncomplicated: Secondary | ICD-10-CM | POA: Diagnosis present

## 2023-12-15 DIAGNOSIS — K5731 Diverticulosis of large intestine without perforation or abscess with bleeding: Secondary | ICD-10-CM

## 2023-12-15 DIAGNOSIS — Z9889 Other specified postprocedural states: Secondary | ICD-10-CM

## 2023-12-15 DIAGNOSIS — K6389 Other specified diseases of intestine: Secondary | ICD-10-CM | POA: Diagnosis present

## 2023-12-15 DIAGNOSIS — D125 Benign neoplasm of sigmoid colon: Secondary | ICD-10-CM | POA: Diagnosis present

## 2023-12-15 DIAGNOSIS — K5939 Other megacolon: Secondary | ICD-10-CM | POA: Diagnosis present

## 2023-12-15 DIAGNOSIS — Z820 Family history of epilepsy and other diseases of the nervous system: Secondary | ICD-10-CM

## 2023-12-15 DIAGNOSIS — Z789 Other specified health status: Secondary | ICD-10-CM

## 2023-12-15 DIAGNOSIS — K8020 Calculus of gallbladder without cholecystitis without obstruction: Secondary | ICD-10-CM | POA: Diagnosis present

## 2023-12-15 DIAGNOSIS — K5669 Other partial intestinal obstruction: Secondary | ICD-10-CM | POA: Diagnosis present

## 2023-12-15 DIAGNOSIS — Z6835 Body mass index (BMI) 35.0-35.9, adult: Secondary | ICD-10-CM | POA: Diagnosis not present

## 2023-12-15 DIAGNOSIS — K56699 Other intestinal obstruction unspecified as to partial versus complete obstruction: Secondary | ICD-10-CM | POA: Diagnosis not present

## 2023-12-15 DIAGNOSIS — Z9049 Acquired absence of other specified parts of digestive tract: Secondary | ICD-10-CM | POA: Diagnosis not present

## 2023-12-15 DIAGNOSIS — E876 Hypokalemia: Secondary | ICD-10-CM | POA: Insufficient documentation

## 2023-12-15 DIAGNOSIS — M1990 Unspecified osteoarthritis, unspecified site: Secondary | ICD-10-CM | POA: Diagnosis present

## 2023-12-15 DIAGNOSIS — R7303 Prediabetes: Secondary | ICD-10-CM | POA: Diagnosis present

## 2023-12-15 DIAGNOSIS — K5733 Diverticulitis of large intestine without perforation or abscess with bleeding: Secondary | ICD-10-CM | POA: Diagnosis present

## 2023-12-15 DIAGNOSIS — K760 Fatty (change of) liver, not elsewhere classified: Secondary | ICD-10-CM | POA: Diagnosis present

## 2023-12-15 DIAGNOSIS — K5730 Diverticulosis of large intestine without perforation or abscess without bleeding: Secondary | ICD-10-CM | POA: Diagnosis not present

## 2023-12-15 DIAGNOSIS — Z79899 Other long term (current) drug therapy: Secondary | ICD-10-CM

## 2023-12-15 DIAGNOSIS — K529 Noninfective gastroenteritis and colitis, unspecified: Secondary | ICD-10-CM | POA: Diagnosis not present

## 2023-12-15 DIAGNOSIS — K625 Hemorrhage of anus and rectum: Secondary | ICD-10-CM | POA: Diagnosis present

## 2023-12-15 DIAGNOSIS — I10 Essential (primary) hypertension: Secondary | ICD-10-CM | POA: Diagnosis present

## 2023-12-15 DIAGNOSIS — K648 Other hemorrhoids: Secondary | ICD-10-CM | POA: Diagnosis present

## 2023-12-15 DIAGNOSIS — Z880 Allergy status to penicillin: Secondary | ICD-10-CM | POA: Diagnosis not present

## 2023-12-15 DIAGNOSIS — Z83719 Family history of colon polyps, unspecified: Secondary | ICD-10-CM

## 2023-12-15 DIAGNOSIS — Z860101 Personal history of adenomatous and serrated colon polyps: Secondary | ICD-10-CM | POA: Diagnosis not present

## 2023-12-15 DIAGNOSIS — K56600 Partial intestinal obstruction, unspecified as to cause: Secondary | ICD-10-CM | POA: Diagnosis not present

## 2023-12-15 DIAGNOSIS — R0902 Hypoxemia: Secondary | ICD-10-CM | POA: Insufficient documentation

## 2023-12-15 LAB — URINALYSIS, ROUTINE W REFLEX MICROSCOPIC
Bilirubin Urine: NEGATIVE
Glucose, UA: NEGATIVE mg/dL
Hgb urine dipstick: NEGATIVE
Ketones, ur: 20 mg/dL — AB
Nitrite: NEGATIVE
Protein, ur: 30 mg/dL — AB
Specific Gravity, Urine: 1.021 (ref 1.005–1.030)
pH: 5 (ref 5.0–8.0)

## 2023-12-15 LAB — COMPREHENSIVE METABOLIC PANEL WITH GFR
ALT: 17 U/L (ref 0–44)
AST: 20 U/L (ref 15–41)
Albumin: 4.1 g/dL (ref 3.5–5.0)
Alkaline Phosphatase: 49 U/L (ref 38–126)
Anion gap: 16 — ABNORMAL HIGH (ref 5–15)
BUN: 9 mg/dL (ref 8–23)
CO2: 21 mmol/L — ABNORMAL LOW (ref 22–32)
Calcium: 9.1 mg/dL (ref 8.9–10.3)
Chloride: 104 mmol/L (ref 98–111)
Creatinine, Ser: 0.93 mg/dL (ref 0.61–1.24)
GFR, Estimated: >60 mL/min (ref >60)
Glucose, Bld: 146 mg/dL — ABNORMAL HIGH (ref 70–99)
Potassium: 3.1 mmol/L — ABNORMAL LOW (ref 3.5–5.1)
Sodium: 141 mmol/L (ref 135–145)
Total Bilirubin: 0.7 mg/dL (ref 0.0–1.2)
Total Protein: 7.9 g/dL (ref 6.5–8.1)

## 2023-12-15 LAB — CBC
HCT: 43.5 % (ref 39.0–52.0)
Hemoglobin: 15.2 g/dL (ref 13.0–17.0)
MCH: 35.3 pg — ABNORMAL HIGH (ref 26.0–34.0)
MCHC: 34.9 g/dL (ref 30.0–36.0)
MCV: 101.2 fL — ABNORMAL HIGH (ref 80.0–100.0)
Platelets: 229 10*3/uL (ref 150–400)
RBC: 4.3 MIL/uL (ref 4.22–5.81)
RDW: 14.4 % (ref 11.5–15.5)
WBC: 7.6 10*3/uL (ref 4.0–10.5)
nRBC: 0 % (ref 0.0–0.2)

## 2023-12-15 LAB — LIPASE, BLOOD: Lipase: 27 U/L (ref 11–51)

## 2023-12-15 MED ORDER — MORPHINE SULFATE (PF) 4 MG/ML IV SOLN
4.0000 mg | Freq: Once | INTRAVENOUS | Status: AC
  Administered 2023-12-15: 4 mg via INTRAVENOUS
  Filled 2023-12-15: qty 1

## 2023-12-15 MED ORDER — POTASSIUM CHLORIDE 20 MEQ PO PACK
40.0000 meq | PACK | Freq: Once | ORAL | Status: AC
  Administered 2023-12-15: 40 meq via ORAL
  Filled 2023-12-15: qty 2

## 2023-12-15 MED ORDER — IOHEXOL 350 MG/ML SOLN
75.0000 mL | Freq: Once | INTRAVENOUS | Status: AC | PRN
  Administered 2023-12-15: 75 mL via INTRAVENOUS

## 2023-12-15 MED ORDER — AMLODIPINE BESYLATE 10 MG PO TABS
10.0000 mg | ORAL_TABLET | Freq: Every day | ORAL | Status: DC
  Administered 2023-12-15 – 2023-12-24 (×9): 10 mg via ORAL
  Filled 2023-12-15 (×9): qty 1

## 2023-12-15 MED ORDER — HEPARIN SODIUM (PORCINE) 5000 UNIT/ML IJ SOLN
5000.0000 [IU] | Freq: Three times a day (TID) | INTRAMUSCULAR | Status: DC
  Administered 2023-12-15: 5000 [IU] via SUBCUTANEOUS
  Filled 2023-12-15: qty 1

## 2023-12-15 MED ORDER — SODIUM CHLORIDE 0.9 % IV BOLUS
500.0000 mL | Freq: Once | INTRAVENOUS | Status: AC
  Administered 2023-12-15: 500 mL via INTRAVENOUS

## 2023-12-15 MED ORDER — LACTATED RINGERS IV SOLN: INTRAVENOUS | Status: AC

## 2023-12-15 MED ORDER — LACTATED RINGERS IV BOLUS: 500.0000 mL | Freq: Once | INTRAVENOUS | Status: DC

## 2023-12-15 MED ORDER — POLYETHYLENE GLYCOL 3350 17 G PO PACK
17.0000 g | PACK | Freq: Two times a day (BID) | ORAL | Status: DC
  Administered 2023-12-15 – 2023-12-23 (×15): 17 g via ORAL
  Filled 2023-12-15 (×17): qty 1

## 2023-12-15 MED ORDER — ACETAMINOPHEN 10 MG/ML IV SOLN: 1000.0000 mg | Freq: Four times a day (QID) | INTRAVENOUS | Status: AC | PRN

## 2023-12-15 NOTE — Progress Notes (Signed)
 Patient arrived to unit from ED on stretcher, pt ambulated to bed, he is alert, oriented, and verbally speaking with staff, bed placed in low position, wheels locked, call bell within reach and alarm on.

## 2023-12-15 NOTE — Plan of Care (Signed)

## 2023-12-15 NOTE — H&P (View-Only) (Signed)
 Consultation  Referring Provider: ER MD/Dr. Rodena Medin Primary Care Physician:  Fleet Contras, MD Primary Gastroenterologist:  Dr. Russella Dar  Reason for Consultation: Acute onset of obstipation, abdominal pain and abnormal CT imaging  HPI: Manuel Morales is a 78 y.o. male with history of hypertension, osteoarthritis and previous history of diverticulitis for which he was hospitalized in 2015. Patient presented to the emergency room earlier today after he had onset about 3 days ago of constipation which he says is never an issue for him.  He is generally very regular and has a normal bowel movement every day.  He developed associated abdominal bloating and discomfort over the last couple of days, no fever or chills, no nausea or vomiting. He says has not been passing much gas over the past couple of days.  After he came to the ER this morning he did have a small bowel movement.  He has been nauseated but has not vomited.  He is now having sharp pains intermittently in his mid abdomen and a lot of rolling and grumbling in his abdomen.  No prior abdominal surgery. He did have colonoscopy previously last done in 2014 with removal of 3 polyps largest 7 mm and all were tubular adenomas, and noted to have moderate left-sided diverticulosis.  CT of the abdomen and pelvis today shows sigmoid diverticulosis without diverticulitis and a segment of thickening of the sigmoid with luminal narrowing question muscular hypertrophy but cannot rule out underlying mass, there is dilation of the colon proximal to the sigmoid.  Also noted mild adrenal thickening bilaterally  Labs today with WBC 7.6/hemoglobin 15.2/hematocrit 47.5/MCV 101 Sodium 141/potassium 3.1/BUN 8/creatinine 0.93 LFTs within normal limits.   Past Medical History:  Diagnosis Date   Arthritis    Diverticulosis    Hypertension     Past Surgical History:  Procedure Laterality Date   COLONOSCOPY  ?1999    Prior to Admission medications    Medication Sig Start Date End Date Taking? Authorizing Provider  XIIDRA 5 % SOLN Place 1 drop into both eyes 2 (two) times daily. 09/27/23  Yes [provider]  amLODipine (NORVASC) 10 MG tablet Take 10 mg by mouth daily. Patient not taking: Reported on 12/15/2023 03/17/21   [provider]    No current facility-administered medications for this encounter.   Current Outpatient Medications  Medication Sig Dispense Refill   XIIDRA 5 % SOLN Place 1 drop into both eyes 2 (two) times daily.     amLODipine (NORVASC) 10 MG tablet Take 10 mg by mouth daily. (Patient not taking: Reported on 12/15/2023)      Allergies as of 12/15/2023 - Review Complete 12/15/2023  Allergen Reaction Noted   Penicillins Hives 05/23/2013    Family History  Problem Relation Age of Onset   Dementia Mother    Colon polyps Brother    Colon cancer Neg Hx    Esophageal cancer Neg Hx    Rectal cancer Neg Hx    Stomach cancer Neg Hx     Social History   Socioeconomic History   Marital status: Single    Spouse name: Not on file   Number of children: Not on file   Years of education: Not on file   Highest education level: Not on file  Occupational History   Not on file  Tobacco Use   Smoking status: Every Day    Current packs/day: 0.50    Average packs/day: 0.5 packs/day for 30.0 years (15.0 ttl pk-yrs)    Types:  Cigarettes   Smokeless tobacco: Never  Vaping Use   Vaping status: Never Used  Substance and Sexual Activity   Alcohol use: No   Drug use: No   Sexual activity: Not on file  Other Topics Concern   Not on file  Social History Narrative   Not on file   Social Drivers of Health   Financial Resource Strain: Not on file  Food Insecurity: Not on file  Transportation Needs: Not on file  Physical Activity: Not on file  Stress: Not on file  Social Connections: Not on file  Intimate Partner Violence: Not on file    Review of Systems: Pertinent positive and negative review  of systems were noted in the above HPI section.  All other review of systems was otherwise negative.   Physical Exam: Vital signs in last 24 hours: Temp:  [98.2 F (36.8 C)-98.5 F (36.9 C)] 98.5 F (36.9 C) (04/10 1132) Pulse Rate:  [69-95] 72 (04/10 1234) Resp:  [18-20] 20 (04/10 1234) BP: (163-192)/(75-103) 172/75 (04/10 1234) SpO2:  [91 %-97 %] 97 % (04/10 1234) Weight:  [108.9 kg] 108.9 kg (04/10 0743)   General:   Alert,  Well-developed, well-nourished, elderly African-American male pleasant and cooperative in NAD on O2 at 2 L for low O2 sat Head:  Normocephalic and atraumatic. Eyes:  Sclera clear, no icterus.   Conjunctiva pink. Ears:  Normal auditory acuity. Nose:  No deformity, discharge,  or lesions. Mouth:  No deformity or lesions.   Neck:  Supple; no masses or thyromegaly. Lungs: Somewhat decreased breath sounds bilaterally, scattered rhonchi.  Heart:  Regular rate and rhythm; no murmurs, clicks, rubs,  or gallops. Abdomen:  Soft, obese, BS active there are some tenderness across the mid abdomen and left lower quadrant/mid left mid quadrant, no guarding or rebound, no palpable mass or hepatosplenomegaly Rectal:  Deferred  Msk:  Symmetrical without gross deformities. . Pulses:  Normal pulses noted. Extremities:  Without clubbing or edema. Neurologic:  Alert and  oriented x4;  grossly normal neurologically. Skin:  Intact without significant lesions or rashes.. Psych:  Alert and cooperative. Normal mood and affect.  Intake/Output from previous day: No intake/output data recorded. Intake/Output this shift: Total I/O In: 500 [IV Piggyback:500] Out: -   Lab Results: Recent Labs    12/15/23 0807  WBC 7.6  HGB 15.2  HCT 43.5  PLT 229   BMET Recent Labs    12/15/23 0807  NA 141  K 3.1*  CL 104  CO2 21*  GLUCOSE 146*  BUN 9  CREATININE 0.93  CALCIUM 9.1   LFT Recent Labs    12/15/23 0807  PROT 7.9  ALBUMIN 4.1  AST 20  ALT 17  ALKPHOS 49   BILITOT 0.7   PT/INR No results for input(s): "LABPROT", "INR" in the last 72 hours. Hepatitis Panel No results for input(s): "HEPBSAG", "HCVAB", "HEPAIGM", "HEPBIGM" in the last 72 hours.  IMPRESSION:  #58 78 year old African-American male with remote history of diverticulitis, and previously noted left-sided diverticulosis on colonoscopy 2014, as well as adenomatous colon polyps who presents to the emergency room with 3-day history of constipation which is quite unusual for him, associated with abdominal pain/discomfort bloating, and nausea.  CT abdomen pelvis with contrast shows sigmoid diverticulosis and segmental thickening of the sigmoid colon with luminal narrowing, no acute diverticulitis.  Narrowing may be related to muscular hypertrophy but cannot rule out underlying colonic mass.  There is dilation of the colon proximal to the sigmoid, fatty  liver and cholelithiasis.  Will need to rule out sigmoid colon mass, versus stricture  #2 cholelithiasis #3 hepatic steatosis #4 hypertension #5 question COPD-smoker  PLAN: Clear liquid diet, n.p.o. after midnight Start MiraLAX twice daily Will plan for flexible sigmoidoscopy with Dr. Myrtie Neither tomorrow, tapwater enema pre procedure .  Procedure was discussed in detail with the patient including indications risk benefits and he is agreeable to proceed. Further recommendations pending findings at endoscopic evaluation.  Holding subcu heparin for tomorrow in anticipation of procedures   Amy Esterwood PA-C 12/15/2023, 1:11 PM   I have taken an interval history, thoroughly reviewed the chart and examined the patient. I agree with the Advanced Practitioner's note, impression and recommendations, and have recorded additional findings, impressions and recommendations below. I performed a substantive portion of this encounter (>50% time spent), including a complete performance of the medical decision making.  My additional thoughts are as  follows:  High-grade colonic obstruction (though not total with patient still passing some semiformed stool and gas) from what appears to be either sigmoid stricture or mass.  Unusual this came on so abruptly and the patient reports no history of constipation.  Colonic dilatation proximal to the sigmoid abnormality on CT with soft distention and tympany on exam.  Nonsurgical abdomen, no signs of sepsis/SIRS. Long overdue for colon polyp surveillance.  Sigmoidoscopy tomorrow. Charlie Pitter III Office:[phone]

## 2023-12-15 NOTE — Plan of Care (Signed)
 FMTS Brief Progress Note  S:Patient reports mild left lower quadrant pain.  Pain is intermittent and stable per patient.  Reports a small amount of emesis prior to receiving medicine earlier. Denies any further nausea and emesis. Will continue to try and tolerate liquids later this evening. Denies any urinary issues. Patient aware of GI plan tomorrow with enema and scope for further assessment.  O: BP (!) (P) 154/75 (BP Location: Left Arm)   Pulse (P) 71   Temp (P) 98 F (36.7 C) (Oral)   Resp (P) 19   Ht 5\' 9"  (1.753 m)   Wt 108.9 kg   SpO2 (P) 96%   BMI 35.44 kg/m   Gen: No acute distress, awake Heart: Regular rate and rhythm, no murmurs appreciated Lung: Clear to auscultation bilaterally in anterior lung fields, normal work of breathing Abd: Soft, distended, left lower quadrant abdominal tenderness   A/P: Manuel Morales is a 78 y.o. male with a past medical history of HTN and diverticulosis presenting with a 3-day history of constipation found to have segmental thickening of the sigmoid colon with luminal narrowing concerning for underlying colonic mass.  Patient stable on assessment. Continues to have multiple BMS, of mixed consistency. Abdominal Pain stable. Plan for scope tomorrow.  - CLD diet >>> NPO at midnight for possible scope with GI tomorrow  - Will get enema in the morning  - Orders per day team   - Orders reviewed. Labs for AM ordered, which was adjusted as needed.  - If condition changes, plan includes re-evaluation.   Peterson Ao, MD 12/15/2023, 9:12 PM PGY-1, [facility] Night Resident  Please page [phone] with questions.

## 2023-12-15 NOTE — Assessment & Plan Note (Signed)
 Noted on CT A/P with contrast.  Last colonoscopy 10 years ago, patient reports 2 polyps removed.  Ongoing tobacco use.  Having some pain with gas.  Agree with GI admission to inpatient service for eval and colonoscopy. - Admit to [facility] Medicine Teaching Service, Dr. Lum Babe attending - NPO with sips for meds, likely colonoscopy tomorrow - Start PRN IV Tylenol 1 g every 6 hours for moderate pain - Appreciate GI recs - Vitals and pulse ox - Consider TOC for tobacco use disorder

## 2023-12-15 NOTE — Consult Note (Addendum)
 Consultation  Referring Provider: ER MD/Dr. Rodena Medin Primary Care Physician:  Fleet Contras, MD Primary Gastroenterologist:  Dr. Russella Dar  Reason for Consultation: Acute onset of obstipation, abdominal pain and abnormal CT imaging  HPI: Manuel Morales is a 78 y.o. male with history of hypertension, osteoarthritis and previous history of diverticulitis for which he was hospitalized in 2015. Patient presented to the emergency room earlier today after he had onset about 3 days ago of constipation which he says is never an issue for him.  He is generally very regular and has a normal bowel movement every day.  He developed associated abdominal bloating and discomfort over the last couple of days, no fever or chills, no nausea or vomiting. He says has not been passing much gas over the past couple of days.  After he came to the ER this morning he did have a small bowel movement.  He has been nauseated but has not vomited.  He is now having sharp pains intermittently in his mid abdomen and a lot of rolling and grumbling in his abdomen.  No prior abdominal surgery. He did have colonoscopy previously last done in 2014 with removal of 3 polyps largest 7 mm and all were tubular adenomas, and noted to have moderate left-sided diverticulosis.  CT of the abdomen and pelvis today shows sigmoid diverticulosis without diverticulitis and a segment of thickening of the sigmoid with luminal narrowing question muscular hypertrophy but cannot rule out underlying mass, there is dilation of the colon proximal to the sigmoid.  Also noted mild adrenal thickening bilaterally  Labs today with WBC 7.6/hemoglobin 15.2/hematocrit 47.5/MCV 101 Sodium 141/potassium 3.1/BUN 8/creatinine 0.93 LFTs within normal limits.   Past Medical History:  Diagnosis Date   Arthritis    Diverticulosis    Hypertension     Past Surgical History:  Procedure Laterality Date   COLONOSCOPY  ?1999    Prior to Admission medications    Medication Sig Start Date End Date Taking? Authorizing Provider  XIIDRA 5 % SOLN Place 1 drop into both eyes 2 (two) times daily. 09/27/23  Yes [provider]  amLODipine (NORVASC) 10 MG tablet Take 10 mg by mouth daily. Patient not taking: Reported on 12/15/2023 03/17/21   [provider]    No current facility-administered medications for this encounter.   Current Outpatient Medications  Medication Sig Dispense Refill   XIIDRA 5 % SOLN Place 1 drop into both eyes 2 (two) times daily.     amLODipine (NORVASC) 10 MG tablet Take 10 mg by mouth daily. (Patient not taking: Reported on 12/15/2023)      Allergies as of 12/15/2023 - Review Complete 12/15/2023  Allergen Reaction Noted   Penicillins Hives 05/23/2013    Family History  Problem Relation Age of Onset   Dementia Mother    Colon polyps Brother    Colon cancer Neg Hx    Esophageal cancer Neg Hx    Rectal cancer Neg Hx    Stomach cancer Neg Hx     Social History   Socioeconomic History   Marital status: Single    Spouse name: Not on file   Number of children: Not on file   Years of education: Not on file   Highest education level: Not on file  Occupational History   Not on file  Tobacco Use   Smoking status: Every Day    Current packs/day: 0.50    Average packs/day: 0.5 packs/day for 30.0 years (15.0 ttl pk-yrs)    Types:  Cigarettes   Smokeless tobacco: Never  Vaping Use   Vaping status: Never Used  Substance and Sexual Activity   Alcohol use: No   Drug use: No   Sexual activity: Not on file  Other Topics Concern   Not on file  Social History Narrative   Not on file   Social Drivers of Health   Financial Resource Strain: Not on file  Food Insecurity: Not on file  Transportation Needs: Not on file  Physical Activity: Not on file  Stress: Not on file  Social Connections: Not on file  Intimate Partner Violence: Not on file    Review of Systems: Pertinent positive and negative review  of systems were noted in the above HPI section.  All other review of systems was otherwise negative.   Physical Exam: Vital signs in last 24 hours: Temp:  [98.2 F (36.8 C)-98.5 F (36.9 C)] 98.5 F (36.9 C) (04/10 1132) Pulse Rate:  [69-95] 72 (04/10 1234) Resp:  [18-20] 20 (04/10 1234) BP: (163-192)/(75-103) 172/75 (04/10 1234) SpO2:  [91 %-97 %] 97 % (04/10 1234) Weight:  [108.9 kg] 108.9 kg (04/10 0743)   General:   Alert,  Well-developed, well-nourished, elderly African-American male pleasant and cooperative in NAD on O2 at 2 L for low O2 sat Head:  Normocephalic and atraumatic. Eyes:  Sclera clear, no icterus.   Conjunctiva pink. Ears:  Normal auditory acuity. Nose:  No deformity, discharge,  or lesions. Mouth:  No deformity or lesions.   Neck:  Supple; no masses or thyromegaly. Lungs: Somewhat decreased breath sounds bilaterally, scattered rhonchi.  Heart:  Regular rate and rhythm; no murmurs, clicks, rubs,  or gallops. Abdomen:  Soft, obese, BS active there are some tenderness across the mid abdomen and left lower quadrant/mid left mid quadrant, no guarding or rebound, no palpable mass or hepatosplenomegaly Rectal:  Deferred  Msk:  Symmetrical without gross deformities. . Pulses:  Normal pulses noted. Extremities:  Without clubbing or edema. Neurologic:  Alert and  oriented x4;  grossly normal neurologically. Skin:  Intact without significant lesions or rashes.. Psych:  Alert and cooperative. Normal mood and affect.  Intake/Output from previous day: No intake/output data recorded. Intake/Output this shift: Total I/O In: 500 [IV Piggyback:500] Out: -   Lab Results: Recent Labs    12/15/23 0807  WBC 7.6  HGB 15.2  HCT 43.5  PLT 229   BMET Recent Labs    12/15/23 0807  NA 141  K 3.1*  CL 104  CO2 21*  GLUCOSE 146*  BUN 9  CREATININE 0.93  CALCIUM 9.1   LFT Recent Labs    12/15/23 0807  PROT 7.9  ALBUMIN 4.1  AST 20  ALT 17  ALKPHOS 49   BILITOT 0.7   PT/INR No results for input(s): "LABPROT", "INR" in the last 72 hours. Hepatitis Panel No results for input(s): "HEPBSAG", "HCVAB", "HEPAIGM", "HEPBIGM" in the last 72 hours.  IMPRESSION:  #58 78 year old African-American male with remote history of diverticulitis, and previously noted left-sided diverticulosis on colonoscopy 2014, as well as adenomatous colon polyps who presents to the emergency room with 3-day history of constipation which is quite unusual for him, associated with abdominal pain/discomfort bloating, and nausea.  CT abdomen pelvis with contrast shows sigmoid diverticulosis and segmental thickening of the sigmoid colon with luminal narrowing, no acute diverticulitis.  Narrowing may be related to muscular hypertrophy but cannot rule out underlying colonic mass.  There is dilation of the colon proximal to the sigmoid, fatty  liver and cholelithiasis.  Will need to rule out sigmoid colon mass, versus stricture  #2 cholelithiasis #3 hepatic steatosis #4 hypertension #5 question COPD-smoker  PLAN: Clear liquid diet, n.p.o. after midnight Start MiraLAX twice daily Will plan for flexible sigmoidoscopy with Dr. Myrtie Neither tomorrow, tapwater enema pre procedure .  Procedure was discussed in detail with the patient including indications risk benefits and he is agreeable to proceed. Further recommendations pending findings at endoscopic evaluation.  Holding subcu heparin for tomorrow in anticipation of procedures   Amy Esterwood PA-C 12/15/2023, 1:11 PM   I have taken an interval history, thoroughly reviewed the chart and examined the patient. I agree with the Advanced Practitioner's note, impression and recommendations, and have recorded additional findings, impressions and recommendations below. I performed a substantive portion of this encounter (>50% time spent), including a complete performance of the medical decision making.  My additional thoughts are as  follows:  High-grade colonic obstruction (though not total with patient still passing some semiformed stool and gas) from what appears to be either sigmoid stricture or mass.  Unusual this came on so abruptly and the patient reports no history of constipation.  Colonic dilatation proximal to the sigmoid abnormality on CT with soft distention and tympany on exam.  Nonsurgical abdomen, no signs of sepsis/SIRS. Long overdue for colon polyp surveillance.  Sigmoidoscopy tomorrow. Charlie Pitter III Office:[phone]

## 2023-12-15 NOTE — H&P (Addendum)
 Hospital Admission History and Physical Service Pager: [phone]  Patient name: Manuel Morales Medical record number: [id number] Date of Birth: December 03, 1945 Age: 78 y.o. Gender: male  Primary Care Provider: Fleet Contras, MD Consultants: GI Code Status: Full Preferred Emergency Contact: Manuel Morales [phone]  Chief Complaint: Constipation  Assessment and Plan: Manuel Morales is a 78 y.o. male presenting with a 3-day history of constipation found to have segmental thickening of the sigmoid colon with luminal narrowing concerning for underlying colonic mass. Differential for this patient's presentation of this includes:  Malignancy, possible: isolated presenting symptom of constipation, bloating and transient nausea lends itself toward mechanical constriction.  CT imaging shows segmental thickening of sigmoid colon: muscular hypertrophy vs colonic mass. Patient has a long, ongoing smoking history. Diverticulitis, less likely: Patient has a history of diverticulitis requiring hospitalization in 2016 and is a smoker, however no current infectious/inflammatory symptomatology, afebrile, WBC WNL, CT showed no pericolic fat stranding. IBD, less likely: Unlikely initial presentation as patient is 78 years old, CT abdomen shows neither distal bowel inflammation characteristic of UC. Rectal involvement only unlikely in Crohn's.  SBO/LBO, less likely: Passed stool earlier this AM which improved pain/symptomatology.  No evidence of SBO on CT AP. Assessment & Plan Colonic mass Noted on CT A/P with contrast.  Last colonoscopy 10 years ago, patient reports 2 polyps removed.  Ongoing tobacco use.  Having some pain with gas.  Agree with GI admission to inpatient service for eval and colonoscopy. - Admit to [facility] Medicine Teaching Service, Dr. Lum Babe attending - NPO with sips for meds, likely colonoscopy tomorrow - Start PRN IV Tylenol 1 g every 6 hours for moderate pain - Appreciate GI  recs - Vitals and pulse ox - Consider TOC for tobacco use disorder HTN (hypertension) To 192/90 on admit.  Asymptomatic.  Previous diagnosis. Patient took amlodipine 10 mg in the past.  Last saw a PCP 2 years ago.  Had a falling out with previous PCP. - Start amlodipine 10 mg daily - Will need new PCP Hypokalemia K: 3.1.  - Start potassium 40 mEq x1 repletion - A.m. BMP Chronic health problem Tobacco use - TOC consult  FEN/GI: Sips with meds in anticipation of colonoscopy tomorrow VTE Prophylaxis: Heparin  Disposition: Home  History of Present Illness:  Manuel Morales is a 78 y.o. male presenting with a 3-day history of constipation 1 episode of transient nausea, and bright red blood per rectum this morning.  Came in today because of a "back-up". Has been unable to pass stool in past 3 days. Has noticed small amounts of blood in both previous stools. Does note he has hemorrhoids. Had colonoscopy 10 years ago. Had two polyps. Has not seen a doctor in 2 years.  Does note he had diverticulitis.  Has not lost weight recently. No night sweats.  No fevers or chills. No nausea or vomiting.  Did dry heave yesterday.  No falls. No palpitations.  Did have a bowel movement this morning in the ED. Described as dark, soft and large volume.  Does have left lower quadrant abdominal pain, crampy, "feels like gas." Eating and drinking normally.  In the ED, patient was given IV morphine 4 mg x1 for pain, a 5 mm bolus of IV normal saline, and was intermittently put on 2 L nasal cannula for low normal O2 saturation (91% - 94%.)  Review Of Systems: Per HPI with the following additions: Denied vision change, vomiting, fever, chills, dysuria, night sweats, unintentional weight loss.  Pertinent Past Medical History: HTN  Denies heart disease, or previous heart attack.  Hx of diverticulitis.  Remainder reviewed in history tab.   Pertinent Past Surgical History: Cataract surgery last  year.   Remainder reviewed in history tab.  Pertinent Social History: Tobacco use: Yes, 4-5 cigarettes per day, decades Alcohol use: Weekly <2 drinks Other Substance use: No marijuana, no other substances Lives with godson.  Pertinent Family History: Negative history of GI cancers  Remainder reviewed in history tab.   Important Outpatient Medications: No medications currently. Does use eyedrops.  Remainder reviewed in medication history.   Objective: BP (!) 172/75 (BP Location: Left Arm)   Pulse 72   Temp 98.5 F (36.9 C) (Oral)   Resp 20   Ht 5\' 9"  (1.753 m)   Wt 108.9 kg   SpO2 97%   BMI 35.44 kg/m  Exam: General: Large man laying in Emergency Department bed, no acute distress, occasionally uncomfortable due to abdominal pain Eyes: Anicteric, EOMI Cardiovascular: Regular rate and rhythm, no murmurs rubs or gallops Respiratory: CTAB, no wheezes rhonchi or rales Gastrointestinal: Distended, LLQ abdominal pain on palpation, otherwise nontender, hypoactive bowel sounds. MSK: ROM grossly intact Neuro: Grossly intact Psych: Appropriate mood and affect  Labs:  CBC BMET  Recent Labs  Lab 12/15/23 0807  WBC 7.6  HGB 15.2  HCT 43.5  PLT 229   Recent Labs  Lab 12/15/23 0807  NA 141  K 3.1*  CL 104  CO2 21*  BUN 9  CREATININE 0.93  GLUCOSE 146*  CALCIUM 9.1     Lipase - 27 UA - positive for ketones, protein, 11-20 WBC  EKG: Awaiting  Imaging Studies Performed:  CTAP (4/10)  IMPRESSION: 1. Sigmoid diverticulosis. Segmental thickening of the sigmoid colon with luminal narrowing may be related to muscular hypertrophy but concerning for underlying colonic mass. Further evaluation with colonoscopy is recommended. 2. Diarrheal state with dilatation of the colon proximal to the sigmoid. No evidence of small-bowel obstruction. Normal appendix. 3. Fatty liver. 4. Cholelithiasis.   Tomie China, MD 12/15/2023, 12:57 PM PGY-1, [facility] Family  Medicine  FPTS Intern pager: [phone], text pages welcome Secure chat group [facility] Teaching Service    I have verified that the resident's  findings are accurately documented in the resident's note. I have made edits and changes where appropriate, and agree with plan.  Celine Mans, MD, PGY-2 [facility] Family Medicine 2:32 PM 12/15/2023

## 2023-12-15 NOTE — Hospital Course (Addendum)
 Manuel Morales is a 78 y.o.male with a past medical history of arthritis,hypertension and remote diverticulitis who was admitted to the family medicine teaching Service at [facility] for 3 day history of constipation. His hospital course is detailed below:  Constipation (colonic stricture)   CT was obtained and demonstrated sigmoid diverticulosis, segmental thickening of the sigmoid colon with luminal narrowing concerning for stricture.  Biopsy was returned negative for malignancy.  GI was consulted recommended Miralax  twice daily and an enema for flexible sigmoidoscopy for further assessment. Procedure demonstrated likely stricture.  Biopsies sent.  Underwent chronic resection 4/14 without complications.  Surgical wound and new ostomy remained clean/dry with no erythema or drainage.  Afebrile.  By postop day 3, patient was ambulating well, passing flatus, and Foley removed.  Discharge condition was stable.   Hypokalemia  Was hypokalemic to 3.1 on admission likely in setting of decreased p.o. intake.  Had persistent hypokalemia throughout admission, requiring multiple potassium infusions.  Magnesium WNL.  His electrolytes were monitored and repleted as needed throughout admission.  Hypertension Patient remained hypertensive throughout stay.  Titrated patient to amlodipine  10 mg, a medication he had previously been on but had not taken in a number of years.  Likely complicated by postsurgical pain/discomfort.  Patient denied headache and vision changes throughout stay.  Other chronic conditions were medically managed with home medications and formulary alternatives as necessary (HTN)  PCP Follow-up Recommendations: Repeat BMP and mag Abdominal surgical site, ostomy evaluation Ensure surgical follow-up Assess BP control

## 2023-12-15 NOTE — Assessment & Plan Note (Signed)
 To 192/90 on admit.  Asymptomatic.  Previous diagnosis. Patient took amlodipine 10 mg in the past.  Last saw a PCP 2 years ago.  Had a falling out with previous PCP. - Start amlodipine 10 mg daily - Will need new PCP

## 2023-12-15 NOTE — ED Provider Notes (Signed)
 [place] EMERGENCY DEPARTMENT AT [facility] Provider Note   CSN: [id number] Arrival date & time: 12/15/23  6387     History  Chief Complaint  Patient presents with   Abdominal Pain   Constipation    Manuel Morales is a 78 y.o. male.  78 year old male with prior medical history as detailed below presents for evaluation.  Patient complains of approximately 3 days of constipation.  Patient reports onset of nausea and vomiting this morning.  He reports decreased flatus passing.  He denies fever.  He reports left lower quadrant abdominal pain.  He reported bloating.  He reports that his symptoms are concerning for being similar to prior episodes of diverticulitis.  He denies prior abdominal surgery in his history.  The history is provided by the patient.       Home Medications Prior to Admission medications   Medication Sig Start Date End Date Taking? Authorizing Provider  amLODipine (NORVASC) 10 MG tablet Take 10 mg by mouth daily. 03/17/21   [provider]  ciprofloxacin (CIPRO) 500 MG tablet Take 1 tablet (500 mg total) by mouth 2 (two) times daily. Patient not taking: Reported on 03/31/2021 09/01/14   Elease Etienne, MD  ibuprofen (ADVIL,MOTRIN) 400 MG tablet Take 400 mg by mouth every 6 (six) hours as needed for mild pain or moderate pain.    [provider]  metroNIDAZOLE (FLAGYL) 500 MG tablet Take 1 tablet (500 mg total) by mouth every 8 (eight) hours. Patient not taking: Reported on 03/31/2021 09/01/14   Elease Etienne, MD  PEG-KCl-NaCl-NaSulf-Na Asc-C (PLENVU) 140 g SOLR Take 1 kit by mouth as directed. 03/31/21   Meryl Dare, MD      Allergies    Penicillins    Review of Systems   Review of Systems  All other systems reviewed and are negative.   Physical Exam Updated Vital Signs BP (!) 181/103 (BP Location: Left Arm)   Pulse 95   Temp 98.2 F (36.8 C)   Resp 18   Ht 5\' 9"  (1.753 m)   Wt 108.9 kg   SpO2 97%   BMI  35.44 kg/m  Physical Exam Vitals and nursing note reviewed.  Constitutional:      General: He is not in acute distress.    Appearance: Normal appearance. He is well-developed.  HENT:     Head: Normocephalic and atraumatic.  Eyes:     Conjunctiva/sclera: Conjunctivae normal.     Pupils: Pupils are equal, round, and reactive to light.  Cardiovascular:     Rate and Rhythm: Normal rate and regular rhythm.     Heart sounds: Normal heart sounds.  Pulmonary:     Effort: Pulmonary effort is normal. No respiratory distress.     Breath sounds: Normal breath sounds.  Abdominal:     General: There is no distension.     Palpations: Abdomen is soft.     Tenderness: There is abdominal tenderness in the left lower quadrant.  Musculoskeletal:        General: No deformity. Normal range of motion.     Cervical back: Normal range of motion and neck supple.  Skin:    General: Skin is warm and dry.  Neurological:     General: No focal deficit present.     Mental Status: He is alert and oriented to person, place, and time.     ED Results / Procedures / Treatments   Labs (all labs ordered are listed, but only abnormal  results are displayed) Labs Reviewed  LIPASE, BLOOD  COMPREHENSIVE METABOLIC PANEL WITH GFR  CBC  URINALYSIS, ROUTINE W REFLEX MICROSCOPIC    EKG None  Radiology No results found.  Procedures Procedures    Medications Ordered in ED Medications  sodium chloride 0.9 % bolus 500 mL (has no administration in time range)  morphine (PF) 4 MG/ML injection 4 mg (has no administration in time range)    ED Course/ Medical Decision Making/ A&P                                 Medical Decision Making Amount and/or Complexity of Data Reviewed Labs: ordered. Radiology: ordered.  Risk Prescription drug management.    Medical Screen Complete  This patient presented to the ED with complaint of abdominal pain.  This complaint involves an extensive number of treatment  options. The initial differential diagnosis includes, but is not limited to, obstruction, mass, infection, metabolic abnormality, etc.  This presentation is: Acute, Chronic, Self-Limited, Previously Undiagnosed, Uncertain Prognosis, Complicated, Systemic Symptoms, and Threat to Life/Bodily Function  Patient with abdominal pain, decreased bowel movements, nausea, vomiting.  CT demonstrates narrowing of the sigmoid colon concerning for underlying mass.  Case discussed with Amy Esterwood, GI.  She recommends admission for further workup.  GI service will see in consultation.  Hospitalist service made aware of case and will evaluate for admission.  Co morbidities that complicated the patient's evaluation  See HPI   Additional history obtained:  External records from outside sources obtained and reviewed including prior ED visits and prior Inpatient records.    Problem List / ED Course:  Abdominal pain, nausea, vomiting, constipation   Reevaluation:  After the interventions noted above, I reevaluated the patient and found that they have: improved   Disposition:  After consideration of the diagnostic results and the patients response to treatment, I feel that the patent would benefit from admission.          Final Clinical Impression(s) / ED Diagnoses Final diagnoses:  Left lower quadrant abdominal pain    Rx / DC Orders ED Discharge Orders     None         Wynetta Fines, MD 12/15/23 1218

## 2023-12-15 NOTE — Assessment & Plan Note (Signed)
 K: 3.1.  - Start potassium 40 mEq x1 repletion - A.m. BMP

## 2023-12-15 NOTE — Hospital Course (Signed)
 78 y.o. M with

## 2023-12-15 NOTE — Assessment & Plan Note (Signed)
 Tobacco use - TOC consult

## 2023-12-15 NOTE — ED Triage Notes (Signed)
 Pt. Stated, Manuel Morales not been able to go to bathroom for a bowel movement in 3 days. DEnies any N/V/D

## 2023-12-16 ENCOUNTER — Inpatient Hospital Stay (HOSPITAL_COMMUNITY): Admitting: Anesthesiology

## 2023-12-16 ENCOUNTER — Encounter (HOSPITAL_COMMUNITY)
Admission: EM | Disposition: A | Discharge status: Home / Self Care | Payer: Self-pay | Source: Home / Self Care | Attending: Family Medicine

## 2023-12-16 DIAGNOSIS — D125 Benign neoplasm of sigmoid colon: Secondary | ICD-10-CM

## 2023-12-16 DIAGNOSIS — K5730 Diverticulosis of large intestine without perforation or abscess without bleeding: Secondary | ICD-10-CM | POA: Diagnosis not present

## 2023-12-16 DIAGNOSIS — K529 Noninfective gastroenteritis and colitis, unspecified: Secondary | ICD-10-CM | POA: Diagnosis not present

## 2023-12-16 DIAGNOSIS — K6389 Other specified diseases of intestine: Secondary | ICD-10-CM

## 2023-12-16 DIAGNOSIS — K56699 Other intestinal obstruction unspecified as to partial versus complete obstruction: Secondary | ICD-10-CM

## 2023-12-16 DIAGNOSIS — K5939 Other megacolon: Secondary | ICD-10-CM | POA: Diagnosis not present

## 2023-12-16 DIAGNOSIS — K56600 Partial intestinal obstruction, unspecified as to cause: Secondary | ICD-10-CM | POA: Diagnosis not present

## 2023-12-16 DIAGNOSIS — K5731 Diverticulosis of large intestine without perforation or abscess with bleeding: Secondary | ICD-10-CM

## 2023-12-16 DIAGNOSIS — R7303 Prediabetes: Secondary | ICD-10-CM | POA: Insufficient documentation

## 2023-12-16 HISTORY — PX: BIOPSY OF SKIN SUBCUTANEOUS TISSUE AND/OR MUCOUS MEMBRANE: SHX6741

## 2023-12-16 HISTORY — PX: FLEXIBLE SIGMOIDOSCOPY: SHX5431

## 2023-12-16 LAB — BASIC METABOLIC PANEL WITH GFR
Anion gap: 11 (ref 5–15)
BUN: 11 mg/dL (ref 8–23)
CO2: 23 mmol/L (ref 22–32)
Calcium: 8.7 mg/dL — ABNORMAL LOW (ref 8.9–10.3)
Chloride: 108 mmol/L (ref 98–111)
Creatinine, Ser: 0.91 mg/dL (ref 0.61–1.24)
GFR, Estimated: >60 mL/min (ref >60)
Glucose, Bld: 128 mg/dL — ABNORMAL HIGH (ref 70–99)
Potassium: 2.8 mmol/L — ABNORMAL LOW (ref 3.5–5.1)
Sodium: 142 mmol/L (ref 135–145)

## 2023-12-16 LAB — HEMOGLOBIN A1C
Hgb A1c MFr Bld: 6.1 % — ABNORMAL HIGH (ref 4.8–5.6)
Mean Plasma Glucose: 128.37 mg/dL

## 2023-12-16 SURGERY — SIGMOIDOSCOPY, FLEXIBLE
Anesthesia: Monitor Anesthesia Care

## 2023-12-16 MED ORDER — SIMETHICONE 80 MG PO CHEW
80.0000 mg | CHEWABLE_TABLET | Freq: Four times a day (QID) | ORAL | Status: DC | PRN
  Administered 2023-12-16 – 2023-12-20 (×7): 80 mg via ORAL
  Filled 2023-12-16 (×7): qty 1

## 2023-12-16 MED ORDER — POTASSIUM CHLORIDE 10 MEQ/100ML IV SOLN
10.0000 meq | INTRAVENOUS | Status: AC
  Administered 2023-12-16 (×3): 10 meq via INTRAVENOUS
  Filled 2023-12-16 (×3): qty 100

## 2023-12-16 MED ORDER — LIDOCAINE 2% (20 MG/ML) 5 ML SYRINGE
INTRAMUSCULAR | Status: DC | PRN
  Administered 2023-12-16: 80 mg via INTRAVENOUS

## 2023-12-16 MED ORDER — HEPARIN SODIUM (PORCINE) 5000 UNIT/ML IJ SOLN
5000.0000 [IU] | Freq: Three times a day (TID) | INTRAMUSCULAR | Status: AC
  Administered 2023-12-16 – 2023-12-18 (×6): 5000 [IU] via SUBCUTANEOUS
  Filled 2023-12-16 (×6): qty 1

## 2023-12-16 MED ORDER — SODIUM CHLORIDE 0.9 % IV SOLN: INTRAVENOUS | Status: DC | PRN

## 2023-12-16 MED ORDER — BOOST / RESOURCE BREEZE PO LIQD CUSTOM
1.0000 | Freq: Three times a day (TID) | ORAL | Status: DC
  Administered 2023-12-16 – 2023-12-23 (×11): 1 via ORAL
  Filled 2023-12-16 (×2): qty 1

## 2023-12-16 MED ORDER — PROPOFOL 10 MG/ML IV BOLUS
INTRAVENOUS | Status: DC | PRN
  Administered 2023-12-16: 200 ug/kg/min via INTRAVENOUS

## 2023-12-16 NOTE — Interval H&P Note (Signed)
 History and Physical Interval Note:  12/16/2023 10:57 AM  Manuel Morales  has presented today for surgery, with the diagnosis of Partial colonic obstruction, question sigmoid stricture.  The various methods of treatment have been discussed with the patient and family. After consideration of risks, benefits and other options for treatment, the patient has consented to  Procedure(s): SIGMOIDOSCOPY, FLEXIBLE (N/A) as a surgical intervention.  [name] history has been reviewed, patient examined, no change in status, stable for surgery.  I have reviewed the patient's chart and labs.  Questions were answered to the patient's satisfaction.    Abdominal exam unchanged from yesterday K = 2.8 this morning, and IV K given  Charlie Pitter III

## 2023-12-16 NOTE — Transfer of Care (Signed)
 Immediate Anesthesia Transfer of Care Note  Patient: Manuel Morales  Procedure(s) Performed: SIGMOIDOSCOPY, FLEXIBLE BIOPSY, SKIN, SUBCUTANEOUS TISSUE, OR MUCOUS MEMBRANE  Patient Location: PACU  Anesthesia Type:MAC  Level of Consciousness: awake and alert   Airway & Oxygen Therapy: Patient Spontanous Breathing  Post-op Assessment: Report given to RN  Post vital signs: Reviewed and stable  Last Vitals:  Vitals Value Taken Time  BP 174/80 12/16/23 1239  Temp 36.8 C 12/16/23 1239  Pulse 60 12/16/23 1239  Resp 18 12/16/23 1239  SpO2 98 % 12/16/23 1239    Last Pain:  Vitals:   12/16/23 1239  TempSrc: Oral  PainSc:          Complications: No notable events documented.

## 2023-12-16 NOTE — Assessment & Plan Note (Signed)
 Tobacco use - TOC consult

## 2023-12-16 NOTE — Consult Note (Signed)
 Manuel Morales 1946-05-04  [id number].    Requesting MD: Dr. Tomie China Chief Complaint/Reason for Consult: Sigmoid stricture  HPI: Manuel Morales is a 78 y.o. male who presented to the ED with constipation.  Patient reports 3-day history of lower abdominal cramping and inability to pass flatus or have a BM.  Prior to this he was in his normal state of health and had daily bowel movements.  He underwent workup and on CT A/P showed sigmoid diverticulosis with a area of segmental thickening of the sigmoid colon with luminal narrowing.  He underwent a flex sig today that was concerning for a sigmoid stricture with severe stenosis measuring 7-10 cm in length in the rectosigmoid colon approximately 25 cm from the anal verge.   GI feels this most likely secondary to a diverticular stricture (hx diverticulitis in 2016). There are biopsies pending as during flex sig there was erythematous, inflamed and nodular mucosa of the sigmoid colon with polypoid features and small skip lesions that were bx.  General surgery was asked to see.   Patient currently reports he has no abdominal pain.  He is tolerating FLD without nausea or vomiting.  Last BM was this morning and loose.  He denies any family history or personal history of any IBD or colon cancer.  Past Medical History: Hypertension Prior Abdominal Surgeries: None Blood Thinners: None Last Colonoscopy: Flex sig today.  He reports his last colonoscopy prior to this was in 2014 that showed polyps without evidence of malignancy, diverticulosis of the descending and sigmoid colon, small internal hemorrhoids and was otherwise normal. Allergies: PCN Tobacco Use: 4 cigarettes/day  Alcohol Use: 2 drinks on Fridays/week Substance use: None Employment: Retired  ROS: ROS As above, see hpi  Family History  Problem Relation Age of Onset   Dementia Mother    Colon polyps Brother    Colon cancer Neg Hx    Esophageal cancer Neg Hx    Rectal  cancer Neg Hx    Stomach cancer Neg Hx     Past Medical History:  Diagnosis Date   Arthritis    Diverticulosis    Hypertension     Past Surgical History:  Procedure Laterality Date   COLONOSCOPY  ?1999    Social History:  reports that he has been smoking cigarettes. He has a 15 pack-year smoking history. He has never used smokeless tobacco. He reports that he does not drink alcohol and does not use drugs.  Allergies:  Allergies  Allergen Reactions   Penicillins Hives    Medications Prior to Admission  Medication Sig Dispense Refill   XIIDRA 5 % SOLN Place 1 drop into both eyes 2 (two) times daily.     amLODipine (NORVASC) 10 MG tablet Take 10 mg by mouth daily. (Patient not taking: Reported on 12/15/2023)       Physical Exam: Blood pressure (!) 174/80, pulse 60, temperature 98.2 F (36.8 C), temperature source Oral, resp. rate 18, height 5\' 9"  (1.753 m), weight 108.9 kg, SpO2 98%. General: pleasant, WD/WN male who is laying in bed in NAD HEENT: head is normocephalic, atraumatic.   Heart: regular, rate, and rhythm.   Lungs: CTAB, no wheezes, rhonchi, or rales noted.  Respiratory effort nonlabored Abd:  Soft, mild distension, NT, +BS. No masses, hernias, or organomegaly MS: no BUE edema Skin: warm and dry  Psych: A&Ox4 with an appropriate affect Neuro: normal speech, thought process intact, gait not assessed  Results for orders placed or performed during the  hospital encounter of 12/15/23 (from the past 48 hours)  Urinalysis, Routine w reflex microscopic -Urine, Clean Catch     Status: Abnormal   Collection Time: 12/15/23  7:47 AM  Result Value Ref Range   Color, Urine AMBER (A) YELLOW    Comment: BIOCHEMICALS MAY BE AFFECTED BY COLOR   APPearance CLOUDY (A) CLEAR   Specific Gravity, Urine 1.021 1.005 - 1.030   pH 5.0 5.0 - 8.0   Glucose, UA NEGATIVE NEGATIVE mg/dL   Hgb urine dipstick NEGATIVE NEGATIVE   Bilirubin Urine NEGATIVE NEGATIVE   Ketones, ur 20 (A)  NEGATIVE mg/dL   Protein, ur 30 (A) NEGATIVE mg/dL   Nitrite NEGATIVE NEGATIVE   Leukocytes,Ua TRACE (A) NEGATIVE   RBC / HPF 0-5 0 - 5 RBC/hpf   WBC, UA 11-20 0 - 5 WBC/hpf   Bacteria, UA MANY (A) NONE SEEN   Squamous Epithelial / HPF 0-5 0 - 5 /HPF   Mucus PRESENT     Comment: Performed at [facility], [address], [place], [place] [address]  Lipase, blood     Status: None   Collection Time: 12/15/23  8:07 AM  Result Value Ref Range   Lipase 27 11 - 51 U/L    Comment: Performed at [facility], [address], [place], [place] [address]  Comprehensive metabolic panel     Status: Abnormal   Collection Time: 12/15/23  8:07 AM  Result Value Ref Range   Sodium 141 135 - 145 mmol/L   Potassium 3.1 (L) 3.5 - 5.1 mmol/L   Chloride 104 98 - 111 mmol/L   CO2 21 (L) 22 - 32 mmol/L   Glucose, Bld 146 (H) 70 - 99 mg/dL    Comment: Glucose reference range applies only to samples taken after fasting for at least 8 hours.   BUN 9 8 - 23 mg/dL   Creatinine, Ser 9.14 0.61 - 1.24 mg/dL   Calcium 9.1 8.9 - 78.2 mg/dL   Total Protein 7.9 6.5 - 8.1 g/dL   Albumin 4.1 3.5 - 5.0 g/dL   AST 20 15 - 41 U/L   ALT 17 0 - 44 U/L   Alkaline Phosphatase 49 38 - 126 U/L   Total Bilirubin 0.7 0.0 - 1.2 mg/dL   GFR, Estimated >95 >62 mL/min    Comment: (NOTE) Calculated using the CKD-EPI Creatinine Equation (2021)    Anion gap 16 (H) 5 - 15    Comment: Performed at [facility], [address] [place], [place] [address]  CBC     Status: Abnormal   Collection Time: 12/15/23  8:07 AM  Result Value Ref Range   WBC 7.6 4.0 - 10.5 K/uL   RBC 4.30 4.22 - 5.81 MIL/uL   Hemoglobin 15.2 13.0 - 17.0 g/dL   HCT 57.8 46.9 - 62.9 %   MCV 101.2 (H) 80.0 - 100.0 fL   MCH 35.3 (H) 26.0 - 34.0 pg   MCHC 34.9 30.0 - 36.0 g/dL   RDW 52.8 41.3 - 24.4 %   Platelets 229 150 - 400 K/uL   nRBC 0.0 0.0 - 0.2 %    Comment: Performed at [facility], [address], [place], [place] [address]   Basic metabolic panel     Status: Abnormal   Collection Time: 12/16/23  4:07 AM  Result Value Ref Range   Sodium 142 135 - 145 mmol/L   Potassium 2.8 (L) 3.5 - 5.1 mmol/L  Chloride 108 98 - 111 mmol/L   CO2 23 22 - 32 mmol/L   Glucose, Bld 128 (H) 70 - 99 mg/dL    Comment: Glucose reference range applies only to samples taken after fasting for at least 8 hours.   BUN 11 8 - 23 mg/dL   Creatinine, Ser 1.61 0.61 - 1.24 mg/dL   Calcium 8.7 (L) 8.9 - 10.3 mg/dL   GFR, Estimated >09 >60 mL/min    Comment: (NOTE) Calculated using the CKD-EPI Creatinine Equation (2021)    Anion gap 11 5 - 15    Comment: Performed at [facility], [address], [place], [place] [address]  Hemoglobin A1c     Status: Abnormal   Collection Time: 12/16/23  4:07 AM  Result Value Ref Range   Hgb A1c MFr Bld 6.1 (H) 4.8 - 5.6 %    Comment: (NOTE) Pre diabetes:          5.7%-6.4%  Diabetes:              >6.4%  Glycemic control for   <7.0% adults with diabetes    Mean Plasma Glucose 128.37 mg/dL    Comment: Performed at [facility], [address], [place], [place] [address]   CT ABDOMEN PELVIS W CONTRAST Result Date: 12/15/2023 CLINICAL DATA:  Abdominal pain, nausea and vomiting. EXAM: CT ABDOMEN AND PELVIS WITH CONTRAST TECHNIQUE: Multidetector CT imaging of the abdomen and pelvis was performed using the standard protocol following bolus administration of intravenous contrast. RADIATION DOSE REDUCTION: This exam was performed according to the departmental dose-optimization program which includes automated exposure control, adjustment of the mA and/or kV according to patient size and/or use of iterative reconstruction technique. CONTRAST:  75mL OMNIPAQUE IOHEXOL 350 MG/ML SOLN COMPARISON:  CT abdomen pelvis dated 08/27/2014. FINDINGS: Lower chest: Bibasilar atelectasis/scarring. No intra-abdominal free air or free fluid. Hepatobiliary: Fatty liver. No biliary dilatation. Small gallstones. No  pericholecystic fluid. Pancreas: Unremarkable. No pancreatic ductal dilatation or surrounding inflammatory changes. Spleen: Normal in size without focal abnormality. Adrenals/Urinary Tract: Mild bilateral adrenal thickening. There is mild bilateral renal parenchyma atrophy. There is no hydronephrosis on either side. There is symmetric enhancement and excretion of contrast by both kidneys. The visualized ureters and urinary bladder appear unremarkable. Stomach/Bowel: Small hiatal hernia. There is sigmoid diverticulosis. Segmental thickening of the sigmoid colon with luminal narrowing may be related to muscular hypertrophy but concerning for underlying colonic mass. Further evaluation with colonoscopy is recommended. There is dilatation of the colon proximal to the sigmoid with loose stool in keeping with diarrheal state. Diffusely decompressed small bowel. No evidence of small-bowel obstruction. The appendix is normal. Vascular/Lymphatic: Mild aortoiliac atherosclerotic disease. The IVC is unremarkable. No portal venous gas. There is no adenopathy. Reproductive: The prostate and seminal vesicles are grossly unremarkable. No pelvic mass. Other: None Musculoskeletal: Degenerative changes of the spine. No acute osseous pathology. IMPRESSION: 1. Sigmoid diverticulosis. Segmental thickening of the sigmoid colon with luminal narrowing may be related to muscular hypertrophy but concerning for underlying colonic mass. Further evaluation with colonoscopy is recommended. 2. Diarrheal state with dilatation of the colon proximal to the sigmoid. No evidence of small-bowel obstruction. Normal appendix. 3. Fatty liver. 4. Cholelithiasis. Electronically Signed   By: Elgie Collard M.D.   On: 12/15/2023 11:35    Anti-infectives (From admission, onward)    None       Assessment/Plan Sigmoid Stricture  This is a 78 year old male who presented with constipation, underwent flex sig  and was found to have severe stenosis  measuring 7-10 cm in length in the rectosigmoid colon approximately 25 cm from the anal verge.   GI feels this most likely secondary to a diverticular stricture (hx diverticulitis in 2016). There are biopsies pending as during flex sig there was erythematous, inflamed and nodular mucosa of the sigmoid colon with polypoid features and small skip lesions that were bx.    Patient is not currently obstructed as GI was able to transverse the stricture with a pediatric colonoscope during his flex sig and he continues to have bowel movements.  Recommend keeping patient on FLD over the weekend, slow bowel prep with plans for sigmoid colectomy on Monday. Pt is in agreement with this plan.   FEN - Do not adv past FLD. BID miralax. Will need abx bowel prep ordered over the weekend.  VTE - SCDs, okay for chem ppx from a general surgery standpoint ID - None  I reviewed nursing notes, Consultant (GI) notes, hospitalist notes, last 24 h vitals and pain scores, last 48 h intake and output, last 24 h labs and trends, and last 24 h imaging results.  Jacinto Halim, [facility] 12/16/2023, 3:22 PM Please see Amion for pager number during day hours 7:00am-4:30pm

## 2023-12-16 NOTE — Assessment & Plan Note (Signed)
 A1c 6.1%.  - Will need outpatient follow-up with new PCP

## 2023-12-16 NOTE — Assessment & Plan Note (Addendum)
 124/75 this AM, now returned to SBPs 150s-170s. Continued to be elevated overnight.  Asymptomatic. - Continue amlodipine 10 mg daily - Will need new PCP, amenable to following with FMTS.

## 2023-12-16 NOTE — Assessment & Plan Note (Addendum)
 Flexible sigmoidoscopy showed stricture. Biopsies taken.  GI does not believe this is a malignancy picture, biopsies pending. Recommended general surgery consult for potential colonic resection. Had 4-5 loose stools over last 24h. No new complaints. - Appreciate GI recs - Surgery consulted, will see, appreciate recs. - Start liquid diet - Continue PRN IV Tylenol 1 g every 6 hours for moderate pain, consider transition to PO as able - Continue miralax BID - Continue IV LR 75 mL/hr, stop time ~ 1300/12 - Appreciate further GI recs - Vitals and pulse ox

## 2023-12-16 NOTE — Progress Notes (Addendum)
   Daily Progress Note Intern Pager: [phone]  Patient name: Manuel Morales Medical record number: [id number] Date of birth: [birth year] Age: 78 y.o. Gender: male  Primary Care Provider: Fleet Contras, MD Consultants: GI Code Status: Full  Pt Overview and Major Events to Date:   Manuel Morales is a 78 year old male who presented with three days of constipation found to have luminal narrowing concerning for colonic mass on CT A/P.  Sigmoidoscopy showed diverticulosis with stricture in rectosigmoid, lower concern for malignancy.  Biopsies taken, hopefully will return over the next 3 to 4 days.  General surgery consulted, will evaluate for inpatient versus outpatient colonic resection. Assessment & Plan Colonic mass Flexible sigmoidoscopy showed stricture. Biopsies taken.  GI does not believe this is a malignancy picture, biopsies pending. Recommended general surgery consult for potential colonic resection. Had 4-5 loose stools over last 24h. No new complaints. - Appreciate GI recs - Surgery consulted, will see, appreciate recs. - Start liquid diet - Continue PRN IV Tylenol 1 g every 6 hours for moderate pain, consider transition to PO as able - Continue miralax BID - Continue IV LR 75 mL/hr, stop time ~ 1300/12 - Appreciate further GI recs - Vitals and pulse ox Hypokalemia 2.8 this morning.  - Started IV potassium 10 mEq x6. - Also gave PO potassium 40 mEq x1 in AM. - A.m. BMP HTN (hypertension) 124/75 this AM, now returned to SBPs 150s-170s. Continued to be elevated overnight.  Asymptomatic. - Continue amlodipine 10 mg daily - Will need new PCP, amenable to following with FMTS. Prediabetes A1c 6.1%.  - Will need outpatient follow-up with new PCP Chronic health problem Tobacco use - TOC consult  FEN/GI: Liquid diet PPx: Heparin, d/c'd last night in setting of colonoscopy tomorrow Dispo: Home  Subjective:   On interview, patient in good spirits, fully sleeping last  night in a hospital setting.  Past 4-5 small bowel movements, loose, likely secondary to MiraLAX.  Understands he is undergoing flexible sigmoidoscopy later today.  Amenable to follow with family medicine outpatient for post hospital visit.  Objective:  BP: 124/75 HR: 66 RR: 17 T: 98.2% O2sat: 96% on RA  Significant vitals over past 24 hours: sBPs 160-180s  Physical Exam:  General: Well-appearing male, NAD, attentive. Cardiovascular: RRR no m/r/g. Respiratory: CTAB. No w/r/r.  Abdomen: Distended, somewhat less than yesterday.  No tenderness in 4 quadrants.  Hypoactive bowel sounds. Extremities: ROM intact.  Nonedematous.  Basic labs:  Most recent CBC Lab Results  Component Value Date   WBC 7.6 12/15/2023   HGB 15.2 12/15/2023   HCT 43.5 12/15/2023   MCV 101.2 (H) 12/15/2023   PLT 229 12/15/2023   Most recent BMP    Latest Ref Rng & Units 12/16/2023    4:07 AM  BMP  Glucose 70 - 99 mg/dL 841   BUN 8 - 23 mg/dL 11   Creatinine 6.60 - 1.24 mg/dL 6.30   Sodium 160 - 109 mmol/L 142   Potassium 3.5 - 5.1 mmol/L 2.8   Chloride 98 - 111 mmol/L 108   CO2 22 - 32 mmol/L 23   Calcium 8.9 - 10.3 mg/dL 8.7     Other pertinent labs:  A1c: 6.1% K: 2.8   Imaging/Diagnostic Tests:  No new imaging.  Tomie China, MD 12/16/2023, 7:17 AM  PGY-1, [facility] Family Medicine FPTS Intern pager: [phone], text pages welcome Secure chat group [facility] Teaching Service

## 2023-12-16 NOTE — Assessment & Plan Note (Addendum)
 2.8 this morning.  - Started IV potassium 10 mEq x6. - Also gave PO potassium 40 mEq x1 in AM. - A.m. BMP

## 2023-12-16 NOTE — Plan of Care (Cosign Needed)
 FMTS Brief Postoperative note  S: On interview, patient is at baseline.  Feels identical to this a.m., some gas pain noted.  Denies headache, vision changes, chest pain, shortness of breath.  Understands he is to due for colonic resection Monday 4/14.  Amenable to simethicone.  O: BP (!) 174/80 (BP Location: Left Arm)   Pulse 60   Temp 98.2 F (36.8 C) (Oral)   Resp 18   Ht 5\' 9"  (1.753 m)   Wt 108.9 kg   SpO2 98%   BMI 35.45 kg/m    Constitutional: NAD, attentive to interview. Cardiovascular: RRR, no M/R/G. Respiratory: CTAB. Abdominal: Hypoactive bowel sounds, nontender to light palpation in 4 quadrants MSK: ROM intact.  Strength 5/5 in all extremities. Neuro: No gross deficits. Psych: Appropriate mood and affect.  A/P: - Added simethicone 80 mg 4 times daily as needed for gas pain - Orders reviewed.  No further adjustments  Tomie China, MD 12/16/2023, 5:19 PM PGY-1, [facility] Family Medicine Night Resident  Please page [phone] with questions.

## 2023-12-16 NOTE — Op Note (Signed)
 [facility] Patient Name: Manuel Morales Procedure Date : 12/16/2023 MRN: [id number] Attending MD: Starr Lake. Myrtie Neither , MD, [id number] Date of Birth: [birth year] CSN: [id number] Age: 78 Admit Type: Inpatient Procedure:                Flexible Sigmoidoscopy Indications:              Generalized abdominal pain, Colonic obstruction,                            sigmoid stricture or mass on imaging Providers:                Sherilyn Cooter L. Myrtie Neither, MD, Stephens Shire RN, RN, Salley Scarlet, Technician Referring MD:             [facility] medicine teaching service Medicines:                Monitored Anesthesia Care Complications:            No immediate complications. Estimated Blood Loss:     Estimated blood loss was minimal. Procedure:                Pre-Anesthesia Assessment:                           - Prior to the procedure, a History and Physical                            was performed, and patient medications and                            allergies were reviewed. [name] tolerance of                            previous anesthesia was also reviewed. The risks                            and benefits of the procedure and the sedation                            options and risks were discussed with the patient.                            All questions were answered, and informed consent                            was obtained. Prior Anticoagulants: The patient has                            taken no anticoagulant or antiplatelet agents. ASA                            Grade Assessment: III - A patient with severe  systemic disease. After reviewing the risks and                            benefits, the patient was deemed in satisfactory                            condition to undergo the procedure.                           After obtaining informed consent, the scope was                            passed under direct vision. The  [id number]-HQ190TL                            ([id number]) Olympus peds colonoscope was introduced                            through the anus and advanced to the the left                            transverse colon. The flexible sigmoidoscopy was                            somewhat difficult due to multiple diverticula in                            the colon. Scope In: 11:06:33 AM Scope Out: 11:22:28 AM Scope Withdrawal Time: 0 hours 0 minutes 1 second  Total Procedure Duration: 0 hours 15 minutes 55 seconds  Findings:      The perianal and digital rectal examinations were normal.      Many diverticula were found in the left colon.      A severe stenosis measuring 7-10cm (in length) was found in the       recto-sigmoid colon and was traversed. Distal aspect at about 25 cm from       the anal verge. This was a severe diverticular stricture with       tortuosity, and within and adjacent to it was the abnormal mucosa noted       below.      A localized area of erythematous, inflamed and nodular mucosa was found       in the sigmoid colon. This was somewhat polypoid but not like a typical       mass, and there were some small skip areas. Biopsies were taken with a       cold forceps for histology.      The lumen of the descending colon and transverse colon was significantly       dilated with a large amount of thick liquid stool, significantly       limiting visualization. Decompression performed. Impression:               - Diverticulosis in the left colon.                           - Stricture in the recto-sigmoid colon. Not  completely obstructed, and thick liquid stool is                            able to pass through the stricture. The pediatric                            colonoscope was able to navigate this area with                            some difficulty. Thus, he should be able to take a                            bowel preparation before a colonic  resection.                           - Erythematous, inflamed and nodular mucosa in the                            sigmoid colon. Biopsied. This looks more like                            chronic diverticular related inflammation than                            neoplasia, but biopsies pending.                           - Dilated in the descending colon and in the                            transverse colon. Recommendation:           - Return patient to hospital ward for ongoing care.                           - Full liquid diet                           - Biopsies pending (sent rush, hopefully back this                            weekend or Monday at latest)                           Patient needs a general surgery consult. Procedure Code(s):        --- Professional ---                           [phone], Sigmoidoscopy, flexible; with biopsy, single                            or multiple Diagnosis Code(s):        --- Professional ---  Z61.096, Other intestinal obstruction unspecified                            as to partial versus complete obstruction                           K52.9, Noninfective gastroenteritis and colitis,                            unspecified                           K63.89, Other specified diseases of intestine                           K59.39, Other megacolon                           R10.84, Generalized abdominal pain                           K57.30, Diverticulosis of large intestine without                            perforation or abscess without bleeding CPT copyright 2022 American Medical Association. All rights reserved. The codes documented in this report are preliminary and upon coder review may  be revised to meet current compliance requirements. Onyx Edgley L. Myrtie Neither, MD 12/16/2023 11:33:24 AM This report has been signed electronically. Number of Addenda: 0

## 2023-12-16 NOTE — Anesthesia Preprocedure Evaluation (Signed)
 Anesthesia Evaluation  Patient identified by MRN, date of birth, ID band Patient awake    Reviewed: Allergy & Precautions, NPO status , Patient's Chart, lab work & pertinent test results, reviewed documented beta blocker date and time   History of Anesthesia Complications Negative for: history of anesthetic complications  Airway Mallampati: III  TM Distance: >3 FB     Dental  (+) Upper Dentures, Lower Dentures   Pulmonary Current Smoker and Patient abstained from smoking.   breath sounds clear to auscultation       Cardiovascular hypertension, (-) angina (-) CAD, (-) Past MI, (-) Cardiac Stents and (-) CABG  Rhythm:Regular Rate:Normal     Neuro/Psych neg Seizures    GI/Hepatic ,neg GERD  ,,(+) neg Cirrhosis      obstipation   Endo/Other    Renal/GU Renal disease     Musculoskeletal  (+) Arthritis ,    Abdominal   Peds  Hematology   Anesthesia Other Findings   Reproductive/Obstetrics                              Anesthesia Physical Anesthesia Plan  ASA: 2  Anesthesia Plan: MAC   Post-op Pain Management:    Induction: Intravenous  PONV Risk Score and Plan: 2 and Propofol infusion  Airway Management Planned: Natural Airway and Simple Face Mask  Additional Equipment:   Intra-op Plan:   Post-operative Plan:   Informed Consent: I have reviewed the patients History and Physical, chart, labs and discussed the procedure including the risks, benefits and alternatives for the proposed anesthesia with the patient or authorized representative who has indicated his/her understanding and acceptance.     Dental advisory given  Plan Discussed with: CRNA  Anesthesia Plan Comments:          Anesthesia Quick Evaluation

## 2023-12-17 DIAGNOSIS — K6389 Other specified diseases of intestine: Secondary | ICD-10-CM | POA: Diagnosis not present

## 2023-12-17 LAB — CBC
HCT: 38.2 % — ABNORMAL LOW (ref 39.0–52.0)
Hemoglobin: 13.5 g/dL (ref 13.0–17.0)
MCH: 35.2 pg — ABNORMAL HIGH (ref 26.0–34.0)
MCHC: 35.3 g/dL (ref 30.0–36.0)
MCV: 99.5 fL (ref 80.0–100.0)
Platelets: 187 10*3/uL (ref 150–400)
RBC: 3.84 MIL/uL — ABNORMAL LOW (ref 4.22–5.81)
RDW: 13.6 % (ref 11.5–15.5)
WBC: 5.7 10*3/uL (ref 4.0–10.5)
nRBC: 0 % (ref 0.0–0.2)

## 2023-12-17 LAB — BASIC METABOLIC PANEL WITH GFR
Anion gap: 10 (ref 5–15)
Anion gap: 10 (ref 5–15)
BUN: 8 mg/dL (ref 8–23)
BUN: 7 mg/dL — ABNORMAL LOW (ref 8–23)
CO2: 24 mmol/L (ref 22–32)
CO2: 25 mmol/L (ref 22–32)
Calcium: 8.3 mg/dL — ABNORMAL LOW (ref 8.9–10.3)
Calcium: 8.6 mg/dL — ABNORMAL LOW (ref 8.9–10.3)
Chloride: 105 mmol/L (ref 98–111)
Chloride: 104 mmol/L (ref 98–111)
Creatinine, Ser: 0.89 mg/dL (ref 0.61–1.24)
Creatinine, Ser: 0.86 mg/dL (ref 0.61–1.24)
GFR, Estimated: >60 mL/min (ref >60)
GFR, Estimated: >60 mL/min (ref >60)
Glucose, Bld: 112 mg/dL — ABNORMAL HIGH (ref 70–99)
Glucose, Bld: 109 mg/dL — ABNORMAL HIGH (ref 70–99)
Potassium: 2.6 mmol/L — CL (ref 3.5–5.1)
Potassium: 3.3 mmol/L — ABNORMAL LOW (ref 3.5–5.1)
Sodium: 139 mmol/L (ref 135–145)
Sodium: 139 mmol/L (ref 135–145)

## 2023-12-17 MED ORDER — POTASSIUM CHLORIDE 10 MEQ/100ML IV SOLN
10.0000 meq | INTRAVENOUS | Status: AC
  Administered 2023-12-17 (×6): 10 meq via INTRAVENOUS
  Filled 2023-12-17 (×6): qty 100

## 2023-12-17 MED ORDER — POTASSIUM CHLORIDE CRYS ER 20 MEQ PO TBCR
40.0000 meq | EXTENDED_RELEASE_TABLET | Freq: Once | ORAL | Status: AC
  Administered 2023-12-17: 40 meq via ORAL
  Filled 2023-12-17: qty 2

## 2023-12-17 NOTE — Assessment & Plan Note (Signed)
 Tobacco use - TOC consult

## 2023-12-17 NOTE — Assessment & Plan Note (Signed)
 A1c 6.1%.  - Will need outpatient follow-up with new PCP

## 2023-12-17 NOTE — Plan of Care (Signed)
  Problem: Education: Goal: Knowledge of General Education information will improve Description: Including pain rating scale, medication(s)/side effects and non-pharmacologic comfort measures Outcome: Progressing   Problem: Nutrition: Goal: Adequate nutrition will be maintained Outcome: Progressing   Problem: Coping: Goal: Level of anxiety will decrease Outcome: Progressing   Problem: Elimination: Goal: Will not experience complications related to bowel motility Outcome: Progressing Goal: Will not experience complications related to urinary retention Outcome: Progressing   Problem: Safety: Goal: Ability to remain free from injury will improve Outcome: Progressing

## 2023-12-17 NOTE — Assessment & Plan Note (Addendum)
 Flexible sigmoidoscopy showed stricture. Biopsies taken.  GI does not believe this is a malignancy picture, biopsies pending. Recommended general surgery consult for potential colonic resection.  - Appreciate GI recs - Surgery consulted, will see, appreciate recs. - Continue liquid diet - Continue PRN IV Tylenol 1 g every 6 hours for moderate pain, consider transition to PO as able - Continue miralax BID - Continue IV LR 75 mL/hr  - Appreciate further GI recs - Vitals and pulse ox

## 2023-12-17 NOTE — Progress Notes (Addendum)
 Daily Progress Note Intern Pager: [phone]  Patient name: Manuel Morales Medical record number: [id number] Date of birth: 1945/12/13 Age: 78 y.o. Gender: male  Primary Care Provider: Charle Congo, MD Consultants: GI, General Surgery Code Status: Full code  Pt Overview and Major Events to Date:  4/10 Admitted  4/11 Flex sigmoidoscopy  Assessment and Plan:  Manuel Morales is a 78 y.o. male with who presented with 3 days of constipation found to have a little narrowing concerning for colonic mass or CT imaging.  Patient previously had a sigmoidoscopy which showed diverticulosis with stricture in the rectosigmoid, and was less suspicious for malignancy.  Biopsies were collected and we will continue to follow up results.  Patient is tentatively scheduled for colonic resection on April 14. Assessment & Plan Colon stricture (HCC) Flexible sigmoidoscopy showed stricture. Biopsies taken.  GI does not believe this is a malignancy picture, biopsies pending. Recommended general surgery consult for potential colonic resection.  - Appreciate GI recs - Surgery consulted, will see, appreciate recs. - Continue liquid diet - Continue PRN IV Tylenol 1 g every 6 hours for moderate pain, consider transition to PO as able - Continue miralax BID - Continue IV LR 75 mL/hr  - Appreciate further GI recs - Vitals and pulse ox HTN (hypertension) 143/54 this AM, ranges of  SBPs 150s-170s. Asymptomatic. - Continue amlodipine 10 mg daily - Patient will follow up with family medicine teaching service for outpatient care Hypokalemia Potassium of 2.6 this morning, replete lytes.  - 10 mEq IV K x 6 and 40 meq of PO potassium ordered  - AM BMP - Replete lytes as needed Prediabetes A1c 6.1%.  - Will need outpatient follow-up with new PCP Chronic health problem Tobacco use - TOC consult  FEN/GI: Full liquid diet PPx: Heparin Dispo:Home pending clinical improvement . Barriers include colonic  resection.   Subjective:  Patient reports that abdominal pain is better. He continues to have loose stools.  Otherwise no complaints of fever or chills nausea or emesis. Anticipates possible surgery on Monday.  Objective: Temp:  [97.9 F (36.6 C)-99.1 F (37.3 C)] 99.1 F (37.3 C) (04/11 2139) Pulse Rate:  [53-67] 58 (04/11 2139) Resp:  [13-18] 17 (04/11 2139) BP: (124-174)/(70-105) 148/70 (04/11 2139) SpO2:  [90 %-98 %] 96 % (04/11 2139) Weight:  [108.9 kg] 108.9 kg (04/11 1019) Physical Exam: General: Appearing male, in no acute distress, attentive and cooperative Cardiovascular: Regular rate and rhythm, no murmurs appreciated Respiratory: Clear to auscultation bilaterally and posterior lung fields Abdomen: Nontender and all quadrants, distention continues to improve  Extremities: Range of motion intact.  Laboratory: Most recent CBC Lab Results  Component Value Date   WBC 7.6 12/15/2023   HGB 15.2 12/15/2023   HCT 43.5 12/15/2023   MCV 101.2 (H) 12/15/2023   PLT 229 12/15/2023   Most recent BMP    Latest Ref Rng & Units 12/16/2023    4:07 AM  BMP  Glucose 70 - 99 mg/dL 130   BUN 8 - 23 mg/dL 11   Creatinine 8.65 - 1.24 mg/dL 7.84   Sodium 696 - 295 mmol/L 142   Potassium 3.5 - 5.1 mmol/L 2.8   Chloride 98 - 111 mmol/L 108   CO2 22 - 32 mmol/L 23   Calcium 8.9 - 10.3 mg/dL 8.7    Other pertinent labs none  Imaging/Diagnostic Tests: No new imaging   Brayton Calin, MD 12/17/2023, 1:31 AM  PGY-1, [place] [facility] FPTS Intern pager:  [phone], text pages welcome Secure chat group [facility] Teaching Service

## 2023-12-17 NOTE — Assessment & Plan Note (Addendum)
 Potassium of 2.6 this morning, replete lytes.  - 10 mEq IV K x 6 and 40 meq of PO potassium ordered  - AM BMP - Replete lytes as needed

## 2023-12-17 NOTE — Assessment & Plan Note (Addendum)
 143/54 this AM, ranges of  SBPs 150s-170s. Asymptomatic. - Continue amlodipine 10 mg daily - Patient will follow up with family medicine teaching service for outpatient care

## 2023-12-17 NOTE — Progress Notes (Signed)
 Colon Bx from yesterday pending.  Surgical service has kindly evaluated the patient and is planning surgical intervention this coming week.  No further testing or treatment recommendations from us  at this time, so the inpatient GI service will sign off and remain available if needed.  Memory Staggers MD

## 2023-12-17 NOTE — Progress Notes (Signed)
 1 Day Post-Op   Subjective/Chief Complaint: Patient denies abdominal pain.  Tolerating bowel prep   Objective: Vital signs in last 24 hours: Temp:  [97.9 F (36.6 C)-99.1 F (37.3 C)] 98.5 F (36.9 C) (04/12 0758) Pulse Rate:  [53-67] 59 (04/12 0758) Resp:  [13-18] 14 (04/12 0758) BP: (136-174)/(54-80) 136/73 (04/12 0758) SpO2:  [90 %-98 %] 95 % (04/12 0758) Weight:  [108.9 kg] 108.9 kg (04/11 1019) Last BM Date : 12/17/23  Intake/Output from previous day: 04/11 0701 - 04/12 0700 In: 1415.6 [P.O.:240; I.V.:1175.6] Out: -  Intake/Output this shift: No intake/output data recorded.  Abdomen: Soft nontender without rebound guarding  Lab Results:  Recent Labs    12/15/23 0807 12/17/23 0435  WBC 7.6 5.7  HGB 15.2 13.5  HCT 43.5 38.2*  PLT 229 187   BMET Recent Labs    12/16/23 0407 12/17/23 0435  NA 142 139  K 2.8* 2.6*  CL 108 105  CO2 23 24  GLUCOSE 128* 112*  BUN 11 8  CREATININE 0.91 0.89  CALCIUM 8.7* 8.3*   PT/INR No results for input(s): "LABPROT", "INR" in the last 72 hours. ABG No results for input(s): "PHART", "HCO3" in the last 72 hours.  Invalid input(s): "PCO2", "PO2"  Studies/Results: CT ABDOMEN PELVIS W CONTRAST Result Date: 12/15/2023 CLINICAL DATA:  Abdominal pain, nausea and vomiting. EXAM: CT ABDOMEN AND PELVIS WITH CONTRAST TECHNIQUE: Multidetector CT imaging of the abdomen and pelvis was performed using the standard protocol following bolus administration of intravenous contrast. RADIATION DOSE REDUCTION: This exam was performed according to the departmental dose-optimization program which includes automated exposure control, adjustment of the mA and/or kV according to patient size and/or use of iterative reconstruction technique. CONTRAST:  75mL OMNIPAQUE IOHEXOL 350 MG/ML SOLN COMPARISON:  CT abdomen pelvis dated 08/27/2014. FINDINGS: Lower chest: Bibasilar atelectasis/scarring. No intra-abdominal free air or free fluid. Hepatobiliary:  Fatty liver. No biliary dilatation. Small gallstones. No pericholecystic fluid. Pancreas: Unremarkable. No pancreatic ductal dilatation or surrounding inflammatory changes. Spleen: Normal in size without focal abnormality. Adrenals/Urinary Tract: Mild bilateral adrenal thickening. There is mild bilateral renal parenchyma atrophy. There is no hydronephrosis on either side. There is symmetric enhancement and excretion of contrast by both kidneys. The visualized ureters and urinary bladder appear unremarkable. Stomach/Bowel: Small hiatal hernia. There is sigmoid diverticulosis. Segmental thickening of the sigmoid colon with luminal narrowing may be related to muscular hypertrophy but concerning for underlying colonic mass. Further evaluation with colonoscopy is recommended. There is dilatation of the colon proximal to the sigmoid with loose stool in keeping with diarrheal state. Diffusely decompressed small bowel. No evidence of small-bowel obstruction. The appendix is normal. Vascular/Lymphatic: Mild aortoiliac atherosclerotic disease. The IVC is unremarkable. No portal venous gas. There is no adenopathy. Reproductive: The prostate and seminal vesicles are grossly unremarkable. No pelvic mass. Other: None Musculoskeletal: Degenerative changes of the spine. No acute osseous pathology. IMPRESSION: 1. Sigmoid diverticulosis. Segmental thickening of the sigmoid colon with luminal narrowing may be related to muscular hypertrophy but concerning for underlying colonic mass. Further evaluation with colonoscopy is recommended. 2. Diarrheal state with dilatation of the colon proximal to the sigmoid. No evidence of small-bowel obstruction. Normal appendix. 3. Fatty liver. 4. Cholelithiasis. Electronically Signed   By: Angus Bark M.D.   On: 12/15/2023 11:35    Anti-infectives: Anti-infectives (From admission, onward)    None       Assessment/Plan: Sigmoid colon stricture-plan for surgery Monday.  Tolerating soft  bowel prep.  Will order a  formal prep tomorrow.   LOS: 2 days    Brandy Cal Yuepheng Schaller md  12/17/2023 Straightforward decision making

## 2023-12-18 DIAGNOSIS — K56699 Other intestinal obstruction unspecified as to partial versus complete obstruction: Secondary | ICD-10-CM | POA: Diagnosis not present

## 2023-12-18 LAB — BASIC METABOLIC PANEL WITH GFR
Anion gap: 11 (ref 5–15)
BUN: 6 mg/dL — ABNORMAL LOW (ref 8–23)
CO2: 24 mmol/L (ref 22–32)
Calcium: 8.8 mg/dL — ABNORMAL LOW (ref 8.9–10.3)
Chloride: 103 mmol/L (ref 98–111)
Creatinine, Ser: 0.9 mg/dL (ref 0.61–1.24)
GFR, Estimated: >60 mL/min (ref >60)
Glucose, Bld: 127 mg/dL — ABNORMAL HIGH (ref 70–99)
Potassium: 2.5 mmol/L — CL (ref 3.5–5.1)
Sodium: 138 mmol/L (ref 135–145)

## 2023-12-18 LAB — SURGICAL PCR SCREEN
MRSA, PCR: NEGATIVE
Staphylococcus aureus: POSITIVE — AB

## 2023-12-18 LAB — CBC
HCT: 40.1 % (ref 39.0–52.0)
Hemoglobin: 14.2 g/dL (ref 13.0–17.0)
MCH: 34.6 pg — ABNORMAL HIGH (ref 26.0–34.0)
MCHC: 35.4 g/dL (ref 30.0–36.0)
MCV: 97.8 fL (ref 80.0–100.0)
Platelets: 202 10*3/uL (ref 150–400)
RBC: 4.1 MIL/uL — ABNORMAL LOW (ref 4.22–5.81)
RDW: 13.2 % (ref 11.5–15.5)
WBC: 5.9 10*3/uL (ref 4.0–10.5)
nRBC: 0 % (ref 0.0–0.2)

## 2023-12-18 MED ORDER — MUPIROCIN 2 % EX OINT
1.0000 | TOPICAL_OINTMENT | Freq: Two times a day (BID) | CUTANEOUS | Status: AC
  Administered 2023-12-19 – 2023-12-23 (×10): 1 via NASAL
  Filled 2023-12-18 (×2): qty 22

## 2023-12-18 MED ORDER — ENSURE PRE-SURGERY PO LIQD
592.0000 mL | Freq: Once | ORAL | Status: DC
  Filled 2023-12-18: qty 592

## 2023-12-18 MED ORDER — ENSURE PRE-SURGERY PO LIQD
296.0000 mL | Freq: Once | ORAL | Status: AC
  Administered 2023-12-19: 296 mL via ORAL
  Filled 2023-12-18: qty 296

## 2023-12-18 MED ORDER — CHLORHEXIDINE GLUCONATE CLOTH 2 % EX PADS
6.0000 | MEDICATED_PAD | Freq: Every day | CUTANEOUS | Status: AC
  Administered 2023-12-19 – 2023-12-23 (×5): 6 via TOPICAL

## 2023-12-18 MED ORDER — CHLORHEXIDINE GLUCONATE CLOTH 2 % EX PADS
6.0000 | MEDICATED_PAD | Freq: Once | CUTANEOUS | Status: AC
  Administered 2023-12-18: 6 via TOPICAL

## 2023-12-18 MED ORDER — POTASSIUM CHLORIDE 10 MEQ/100ML IV SOLN
10.0000 meq | INTRAVENOUS | Status: AC
  Administered 2023-12-18 (×6): 10 meq via INTRAVENOUS
  Filled 2023-12-18 (×6): qty 100

## 2023-12-18 MED ORDER — POTASSIUM CHLORIDE CRYS ER 20 MEQ PO TBCR
40.0000 meq | EXTENDED_RELEASE_TABLET | Freq: Once | ORAL | Status: AC
  Administered 2023-12-18: 40 meq via ORAL
  Filled 2023-12-18: qty 2

## 2023-12-18 MED ORDER — CHLORHEXIDINE GLUCONATE CLOTH 2 % EX PADS
6.0000 | MEDICATED_PAD | Freq: Once | CUTANEOUS | Status: AC
  Administered 2023-12-19: 6 via TOPICAL

## 2023-12-18 NOTE — Assessment & Plan Note (Signed)
 A1c 6.1%.  - Will need outpatient follow-up as above

## 2023-12-18 NOTE — Assessment & Plan Note (Addendum)
 Resection tomorrow.  Last dose of heparin today at 1400.  Completing prep today.  GI signed off.  Continue to await flex sigmoid biopsy results. - Colonic resection tomorrow with Dr. Alethea Andes. - Surgery following, appreciate recs - Restarted clears diet - Continue PRN simethicone for flatulence/gas pain - Continue miralax BID - Vitals and pulse ox

## 2023-12-18 NOTE — Assessment & Plan Note (Addendum)
 128/77 this AM, now SBPs and 160s.  We will consider adding to hypertensive regimen after surgery. - Continue amlodipine 10 mg daily - Patient will follow up with family medicine teaching service for outpatient care

## 2023-12-18 NOTE — Plan of Care (Signed)
  Problem: Clinical Measurements: Goal: Diagnostic test results will improve Outcome: Not Progressing   Problem: Nutrition: Goal: Adequate nutrition will be maintained Outcome: Not Progressing   Problem: Elimination: Goal: Will not experience complications related to bowel motility Outcome: Not Progressing   

## 2023-12-18 NOTE — Progress Notes (Signed)
 2 Days Post-Op   Subjective/Chief Complaint: Patient tolerating prep well.  Bowels are clearing.  No pain.   Objective: Vital signs in last 24 hours: Temp:  [98.1 F (36.7 C)-98.9 F (37.2 C)] 98.1 F (36.7 C) (04/13 0838) Pulse Rate:  [58-77] 60 (04/13 0838) Resp:  [16-18] 18 (04/12 2009) BP: (128-167)/(69-91) 162/69 (04/13 0838) SpO2:  [93 %-100 %] 99 % (04/13 0838) Last BM Date : 12/17/23  Intake/Output from previous day: 04/12 0701 - 04/13 0700 In: 2914.2 [P.O.:957; I.V.:1341.9; IV Piggyback:615.3] Out: -  Intake/Output this shift: No intake/output data recorded.  Abdomen: Soft nontender without distention.  Lab Results:  Recent Labs    12/17/23 0435  WBC 5.7  HGB 13.5  HCT 38.2*  PLT 187   BMET Recent Labs    12/17/23 0435 12/17/23 1521  NA 139 139  K 2.6* 3.3*  CL 105 104  CO2 24 25  GLUCOSE 112* 109*  BUN 8 7*  CREATININE 0.89 0.86  CALCIUM 8.3* 8.6*   PT/INR No results for input(s): "LABPROT", "INR" in the last 72 hours. ABG No results for input(s): "PHART", "HCO3" in the last 72 hours.  Invalid input(s): "PCO2", "PO2"  Studies/Results: No results found.  Anti-infectives: Anti-infectives (From admission, onward)    None       Assessment/Plan: Sigmoid stricture secondary to diverticulitis-tolerating prep.  Complete prep today.  Plan for tentative surgery tomorrow with Dr. Alethea Andes for sigmoid colectomy.  Discussed with patient.  He has no further questions.   LOS: 3 days    Rodrigo Clara MD 12/18/2023 Low level

## 2023-12-18 NOTE — Assessment & Plan Note (Signed)
 Tobacco use - TOC consult

## 2023-12-18 NOTE — Progress Notes (Signed)
   Daily Progress Note Intern Pager: [phone]  Patient name: Manuel Morales Medical record number: [id number] Date of birth: [birth year] Age: 78 y.o. Gender: male  Primary Care Provider: Charle Congo, MD Consultants: GI (signed off), general surgery Code Status: Full  Pt Overview and Major Events to Date:   Manuel Morales is a 78 year old male who presented with 3 days of constipation found to have colonic mass likely stricture on flexible sigmoidoscopy.  Scheduled for bowel resection 4/14 with general surgery.  Undergoing bowel prep today. Assessment & Plan Colon stricture [facility]) Resection tomorrow.  Last dose of heparin today at 1400.  Completing prep today.  GI signed off.  Continue to await flex sigmoid biopsy results. - Colonic resection tomorrow with Dr. Alethea Andes. - Surgery following, appreciate recs - Restarted clears diet - Continue PRN simethicone for flatulence/gas pain - Continue miralax BID - Vitals and pulse ox Hypokalemia Potassium of 3.3 ? 2.5.  Likely related to massive fluid intake, bowel prep. - PO potassium chloride 40 mEq x1 - IV potassium 60 mEq, 10 mEq x 6 over the next 6 hours - AM BMP at 4 AM 4/14 - Replete lytes as needed HTN (hypertension) 128/77 this AM, now SBPs and 160s.  We will consider adding to hypertensive regimen after surgery. - Continue amlodipine 10 mg daily - Patient will follow up with family medicine teaching service for outpatient care Prediabetes A1c 6.1%.  - Will need outpatient follow-up as above Chronic health problem Tobacco use - TOC consult  FEN/GI: Bowel prep PPx: Heparin Dispo: Home pending clinical improvement.  Barriers include colonic resection.   Subjective:   On interview, patient says he is having multiple yellow liquid stools per day.  This makes him uncomfortable.  Continues to have gas pains despite simethicone PRNs.  Otherwise no new complaints.  No chest pain or shortness of breath.  Is prepared for surgery  tomorrow.  Objective:  BP: 128/77 HR: 72 RR: 18 T: 98.8 O2sat: 100%  Significant vitals over past 24 hours:   Physical Exam:  General: Well-appearing male, NAD, attentive, somewhat uncomfortable Cardiovascular: RRR no m/r/g. Respiratory: CTAB. No w/r/r.  Abdomen: No tenderness in 4 quadrants.  Bowel sounds present. Extremities: ROM intact.  Nonedematous.  Basic labs:  Most recent CBC Lab Results  Component Value Date   WBC 5.7 12/17/2023   HGB 13.5 12/17/2023   HCT 38.2 (L) 12/17/2023   MCV 99.5 12/17/2023   PLT 187 12/17/2023   Most recent BMP    Latest Ref Rng & Units 12/17/2023    3:21 PM  BMP  Glucose 70 - 99 mg/dL 295   BUN 8 - 23 mg/dL 7   Creatinine 6.21 - 3.08 mg/dL 6.57   Sodium 846 - 962 mmol/L 139   Potassium 3.5 - 5.1 mmol/L 3.3   Chloride 98 - 111 mmol/L 104   CO2 22 - 32 mmol/L 25   Calcium 8.9 - 10.3 mg/dL 8.6     Other pertinent labs:  Potassium 3.3 ? 2.5.  Imaging/Diagnostic Tests:  No new imaging.  Gwyndolyn Lerner, MD 12/18/2023, 7:27 AM  PGY-1, [facility] FPTS Intern pager: [phone], text pages welcome Secure chat group [facility] Teaching Service

## 2023-12-18 NOTE — Assessment & Plan Note (Addendum)
 Potassium of 3.3 ? 2.5.  Likely related to massive fluid intake, bowel prep. - PO potassium chloride 40 mEq x1 - IV potassium 60 mEq, 10 mEq x 6 over the next 6 hours - AM BMP at 4 AM 4/14 - Replete lytes as needed

## 2023-12-19 ENCOUNTER — Encounter (HOSPITAL_COMMUNITY)
Admission: EM | Disposition: A | Discharge status: Home / Self Care | Payer: Self-pay | Source: Home / Self Care | Attending: Family Medicine

## 2023-12-19 ENCOUNTER — Encounter (HOSPITAL_COMMUNITY): Payer: Self-pay

## 2023-12-19 ENCOUNTER — Other Ambulatory Visit: Payer: Self-pay

## 2023-12-19 ENCOUNTER — Inpatient Hospital Stay (HOSPITAL_COMMUNITY): Admitting: Anesthesiology

## 2023-12-19 DIAGNOSIS — K5730 Diverticulosis of large intestine without perforation or abscess without bleeding: Secondary | ICD-10-CM

## 2023-12-19 DIAGNOSIS — K5939 Other megacolon: Secondary | ICD-10-CM

## 2023-12-19 DIAGNOSIS — K56699 Other intestinal obstruction unspecified as to partial versus complete obstruction: Secondary | ICD-10-CM | POA: Diagnosis not present

## 2023-12-19 DIAGNOSIS — K6389 Other specified diseases of intestine: Secondary | ICD-10-CM

## 2023-12-19 HISTORY — PX: LAPAROSCOPIC SIGMOID COLECTOMY: SHX5928

## 2023-12-19 HISTORY — PX: COLOSTOMY: SHX63

## 2023-12-19 LAB — BASIC METABOLIC PANEL WITH GFR
Anion gap: 10 (ref 5–15)
Anion gap: 12 (ref 5–15)
BUN: 5 mg/dL — ABNORMAL LOW (ref 8–23)
BUN: 5 mg/dL — ABNORMAL LOW (ref 8–23)
CO2: 23 mmol/L (ref 22–32)
CO2: 24 mmol/L (ref 22–32)
Calcium: 8.8 mg/dL — ABNORMAL LOW (ref 8.9–10.3)
Calcium: 8.7 mg/dL — ABNORMAL LOW (ref 8.9–10.3)
Chloride: 106 mmol/L (ref 98–111)
Chloride: 103 mmol/L (ref 98–111)
Creatinine, Ser: 0.84 mg/dL (ref 0.61–1.24)
Creatinine, Ser: 0.94 mg/dL (ref 0.61–1.24)
GFR, Estimated: >60 mL/min (ref >60)
GFR, Estimated: >60 mL/min (ref >60)
Glucose, Bld: 110 mg/dL — ABNORMAL HIGH (ref 70–99)
Glucose, Bld: 130 mg/dL — ABNORMAL HIGH (ref 70–99)
Potassium: 2.7 mmol/L — CL (ref 3.5–5.1)
Potassium: 4 mmol/L (ref 3.5–5.1)
Sodium: 139 mmol/L (ref 135–145)
Sodium: 139 mmol/L (ref 135–145)

## 2023-12-19 LAB — ABO/RH: ABO/RH(D): A POS

## 2023-12-19 LAB — TYPE AND SCREEN
ABO/RH(D): A POS
Antibody Screen: NEGATIVE

## 2023-12-19 LAB — SURGICAL PATHOLOGY

## 2023-12-19 SURGERY — COLECTOMY, SIGMOID, LAPAROSCOPIC
Anesthesia: General

## 2023-12-19 MED ORDER — LACTATED RINGERS IV SOLN: INTRAVENOUS | Status: DC | PRN

## 2023-12-19 MED ORDER — HYDROMORPHONE HCL 1 MG/ML IJ SOLN
INTRAMUSCULAR | Status: AC
  Filled 2023-12-19: qty 0.5

## 2023-12-19 MED ORDER — ACETAMINOPHEN 500 MG PO TABS
1000.0000 mg | ORAL_TABLET | Freq: Four times a day (QID) | ORAL | Status: DC
  Administered 2023-12-19 – 2023-12-24 (×16): 1000 mg via ORAL
  Filled 2023-12-19 (×19): qty 2

## 2023-12-19 MED ORDER — OXYCODONE HCL 5 MG/5ML PO SOLN: 5.0000 mg | Freq: Once | ORAL | Status: DC | PRN

## 2023-12-19 MED ORDER — LIDOCAINE HCL URETHRAL/MUCOSAL 2 % EX GEL
CUTANEOUS | Status: AC
  Filled 2023-12-19: qty 11

## 2023-12-19 MED ORDER — MORPHINE SULFATE (PF) 2 MG/ML IV SOLN
2.0000 mg | INTRAVENOUS | Status: DC | PRN
  Administered 2023-12-20: 2 mg via INTRAVENOUS
  Filled 2023-12-19 (×2): qty 1

## 2023-12-19 MED ORDER — ROCURONIUM BROMIDE 10 MG/ML (PF) SYRINGE
PREFILLED_SYRINGE | INTRAVENOUS | Status: DC | PRN
  Administered 2023-12-19: 20 mg via INTRAVENOUS
  Administered 2023-12-19: 50 mg via INTRAVENOUS
  Administered 2023-12-19: 10 mg via INTRAVENOUS

## 2023-12-19 MED ORDER — CEFOTETAN DISODIUM 2 G IJ SOLR
INTRAMUSCULAR | Status: AC
  Filled 2023-12-19: qty 2

## 2023-12-19 MED ORDER — METRONIDAZOLE 500 MG PO TABS: 1000.0000 mg | ORAL_TABLET | ORAL | Status: DC

## 2023-12-19 MED ORDER — ENOXAPARIN SODIUM 40 MG/0.4ML IJ SOSY: 40.0000 mg | PREFILLED_SYRINGE | INTRAMUSCULAR | Status: DC

## 2023-12-19 MED ORDER — DEXAMETHASONE SODIUM PHOSPHATE 10 MG/ML IJ SOLN
INTRAMUSCULAR | Status: DC | PRN
  Administered 2023-12-19: 10 mg via INTRAVENOUS

## 2023-12-19 MED ORDER — LACTATED RINGERS IV SOLN: INTRAVENOUS | Status: DC

## 2023-12-19 MED ORDER — CHLORHEXIDINE GLUCONATE 0.12 % MT SOLN
OROMUCOSAL | Status: AC
Start: 2023-12-19 — End: 2023-12-19
  Filled 2023-12-19: qty 15

## 2023-12-19 MED ORDER — PHENYLEPHRINE 80 MCG/ML (10ML) SYRINGE FOR IV PUSH (FOR BLOOD PRESSURE SUPPORT)
PREFILLED_SYRINGE | INTRAVENOUS | Status: DC | PRN
  Administered 2023-12-19: 160 ug via INTRAVENOUS

## 2023-12-19 MED ORDER — OXYCODONE HCL 5 MG PO TABS
5.0000 mg | ORAL_TABLET | ORAL | Status: DC | PRN
  Administered 2023-12-19 (×2): 5 mg via ORAL
  Administered 2023-12-20 – 2023-12-21 (×5): 10 mg via ORAL
  Filled 2023-12-19 (×4): qty 2
  Filled 2023-12-19: qty 1
  Filled 2023-12-19: qty 2
  Filled 2023-12-19: qty 1

## 2023-12-19 MED ORDER — METRONIDAZOLE 500 MG/100ML IV SOLN
INTRAVENOUS | Status: AC
  Filled 2023-12-19: qty 100

## 2023-12-19 MED ORDER — GABAPENTIN 300 MG PO CAPS
ORAL_CAPSULE | ORAL | Status: AC
  Administered 2023-12-19: 300 mg via ORAL
  Filled 2023-12-19: qty 1

## 2023-12-19 MED ORDER — METHOCARBAMOL 500 MG PO TABS: 500.0000 mg | ORAL_TABLET | Freq: Four times a day (QID) | ORAL | Status: DC | PRN

## 2023-12-19 MED ORDER — BUPIVACAINE-EPINEPHRINE 0.25% -1:200000 IJ SOLN
INTRAMUSCULAR | Status: DC | PRN
  Administered 2023-12-19: 20 mL

## 2023-12-19 MED ORDER — POVIDONE-IODINE 10 % EX OINT
TOPICAL_OINTMENT | CUTANEOUS | Status: AC
  Filled 2023-12-19: qty 28.35

## 2023-12-19 MED ORDER — SUGAMMADEX SODIUM 200 MG/2ML IV SOLN
INTRAVENOUS | Status: DC | PRN
  Administered 2023-12-19: 300 mg via INTRAVENOUS

## 2023-12-19 MED ORDER — 0.9 % SODIUM CHLORIDE (POUR BTL) OPTIME
TOPICAL | Status: DC | PRN
  Administered 2023-12-19: 1000 mL

## 2023-12-19 MED ORDER — GABAPENTIN 300 MG PO CAPS: 300.0000 mg | ORAL_CAPSULE | ORAL | Status: AC

## 2023-12-19 MED ORDER — SODIUM CHLORIDE 0.9 % IV SOLN: 2.0000 g | INTRAVENOUS | Status: DC

## 2023-12-19 MED ORDER — CIPROFLOXACIN IN D5W 400 MG/200ML IV SOLN
400.0000 mg | INTRAVENOUS | Status: AC
  Administered 2023-12-19: 400 mg via INTRAVENOUS
  Filled 2023-12-19: qty 200

## 2023-12-19 MED ORDER — PHENYLEPHRINE HCL-NACL 20-0.9 MG/250ML-% IV SOLN
INTRAVENOUS | Status: DC | PRN
  Administered 2023-12-19: 30 ug/min via INTRAVENOUS

## 2023-12-19 MED ORDER — CHLORHEXIDINE GLUCONATE 0.12 % MT SOLN: 15.0000 mL | Freq: Once | OROMUCOSAL | Status: DC

## 2023-12-19 MED ORDER — FENTANYL CITRATE (PF) 250 MCG/5ML IJ SOLN
INTRAMUSCULAR | Status: DC | PRN
  Administered 2023-12-19 (×3): 50 ug via INTRAVENOUS
  Administered 2023-12-19: 100 ug via INTRAVENOUS

## 2023-12-19 MED ORDER — METRONIDAZOLE 500 MG/100ML IV SOLN
500.0000 mg | INTRAVENOUS | Status: AC
  Administered 2023-12-19: 500 mg via INTRAVENOUS

## 2023-12-19 MED ORDER — LIDOCAINE 2% (20 MG/ML) 5 ML SYRINGE
INTRAMUSCULAR | Status: DC | PRN
  Administered 2023-12-19: 60 mg via INTRAVENOUS

## 2023-12-19 MED ORDER — ORAL CARE MOUTH RINSE: 15.0000 mL | Freq: Once | OROMUCOSAL | Status: DC

## 2023-12-19 MED ORDER — SODIUM CHLORIDE 0.9 % IR SOLN
Status: DC | PRN
  Administered 2023-12-19: 2000 mL

## 2023-12-19 MED ORDER — POTASSIUM CHLORIDE 10 MEQ/100ML IV SOLN
INTRAVENOUS | Status: AC
  Filled 2023-12-19: qty 100

## 2023-12-19 MED ORDER — NEOMYCIN SULFATE 500 MG PO TABS: 1000.0000 mg | ORAL_TABLET | ORAL | Status: DC

## 2023-12-19 MED ORDER — ACETAMINOPHEN 500 MG PO TABS
ORAL_TABLET | ORAL | Status: AC
Start: 2023-12-19 — End: 2023-12-19
  Administered 2023-12-19: 1000 mg via ORAL
  Filled 2023-12-19: qty 2

## 2023-12-19 MED ORDER — OXYCODONE HCL 5 MG PO TABS: 5.0000 mg | ORAL_TABLET | Freq: Once | ORAL | Status: DC | PRN

## 2023-12-19 MED ORDER — BUPIVACAINE-EPINEPHRINE (PF) 0.25% -1:200000 IJ SOLN
INTRAMUSCULAR | Status: AC
  Filled 2023-12-19: qty 30

## 2023-12-19 MED ORDER — FENTANYL CITRATE (PF) 250 MCG/5ML IJ SOLN
INTRAMUSCULAR | Status: AC
  Filled 2023-12-19: qty 5

## 2023-12-19 MED ORDER — PROPOFOL 10 MG/ML IV BOLUS
INTRAVENOUS | Status: DC | PRN
  Administered 2023-12-19: 130 mg via INTRAVENOUS

## 2023-12-19 MED ORDER — ALVIMOPAN 12 MG PO CAPS
ORAL_CAPSULE | ORAL | Status: AC
  Administered 2023-12-19: 12 mg via ORAL
  Filled 2023-12-19: qty 1

## 2023-12-19 MED ORDER — ONDANSETRON HCL 4 MG/2ML IJ SOLN
INTRAMUSCULAR | Status: DC | PRN
  Administered 2023-12-19: 4 mg via INTRAVENOUS

## 2023-12-19 MED ORDER — ONDANSETRON HCL 4 MG/2ML IJ SOLN: 4.0000 mg | Freq: Once | INTRAMUSCULAR | Status: DC | PRN

## 2023-12-19 MED ORDER — LIDOCAINE HCL (CARDIAC) PF 100 MG/5ML IV SOSY
PREFILLED_SYRINGE | INTRAVENOUS | Status: AC
  Filled 2023-12-19: qty 5

## 2023-12-19 MED ORDER — HYDROMORPHONE HCL 1 MG/ML IJ SOLN
INTRAMUSCULAR | Status: DC | PRN
  Administered 2023-12-19 (×2): .5 mg via INTRAVENOUS

## 2023-12-19 MED ORDER — ACETAMINOPHEN 500 MG PO TABS: 1000.0000 mg | ORAL_TABLET | ORAL | Status: AC

## 2023-12-19 MED ORDER — HYDROMORPHONE HCL 1 MG/ML IJ SOLN: 0.2500 mg | INTRAMUSCULAR | Status: DC | PRN

## 2023-12-19 MED ORDER — ALVIMOPAN 12 MG PO CAPS: 12.0000 mg | ORAL_CAPSULE | ORAL | Status: AC

## 2023-12-19 MED ORDER — POTASSIUM CHLORIDE 10 MEQ/100ML IV SOLN
10.0000 meq | INTRAVENOUS | Status: AC
  Administered 2023-12-19 (×6): 10 meq via INTRAVENOUS
  Filled 2023-12-19 (×6): qty 100

## 2023-12-19 MED ORDER — AMISULPRIDE (ANTIEMETIC) 5 MG/2ML IV SOLN: 10.0000 mg | Freq: Once | INTRAVENOUS | Status: DC | PRN

## 2023-12-19 MED ORDER — LIDOCAINE HCL URETHRAL/MUCOSAL 2 % EX GEL
CUTANEOUS | Status: DC | PRN
  Administered 2023-12-19: 1 via URETHRAL

## 2023-12-19 SURGICAL SUPPLY — 78 items
APPLIER CLIP ROT 10 11.4 M/L (STAPLE) IMPLANT
BAG COUNTER SPONGE SURGICOUNT (BAG) ×2 IMPLANT
BLADE CLIPPER SURG (BLADE) IMPLANT
BLADE SURG 10 STRL SS (BLADE) ×2 IMPLANT
CANISTER SUCT 3000ML PPV (MISCELLANEOUS) ×2 IMPLANT
CATH COUDE FOLEY 2W 5CC 16FR (CATHETERS) IMPLANT
CELLS DAT CNTRL 66122 CELL SVR (MISCELLANEOUS) IMPLANT
CHLORAPREP W/TINT 26 (MISCELLANEOUS) ×2 IMPLANT
CLIP APPLIE ROT 10 11.4 M/L (STAPLE) IMPLANT
COVER MAYO STAND STRL (DRAPES) ×4 IMPLANT
COVER SURGICAL LIGHT HANDLE (MISCELLANEOUS) ×4 IMPLANT
DRAPE HALF SHEET 40X57 (DRAPES) ×2 IMPLANT
DRAPE UTILITY XL STRL (DRAPES) ×2 IMPLANT
DRAPE WARM FLUID 44X44 (DRAPES) ×2 IMPLANT
DRSG OPSITE POSTOP 4X10 (GAUZE/BANDAGES/DRESSINGS) IMPLANT
DRSG OPSITE POSTOP 4X8 (GAUZE/BANDAGES/DRESSINGS) IMPLANT
DRSG TEGADERM 2-3/8X2-3/4 SM (GAUZE/BANDAGES/DRESSINGS) IMPLANT
ELECT BLADE 6.5 EXT (BLADE) ×2 IMPLANT
ELECT CAUTERY BLADE 6.4 (BLADE) ×2 IMPLANT
ELECT REM PT RETURN 9FT ADLT (ELECTROSURGICAL) ×2 IMPLANT
ELECTRODE REM PT RTRN 9FT ADLT (ELECTROSURGICAL) ×2 IMPLANT
GAUZE SPONGE 2X2 8PLY STRL LF (GAUZE/BANDAGES/DRESSINGS) IMPLANT
GLOVE BIO SURGEON STRL SZ7.5 (GLOVE) ×4 IMPLANT
GLOVE BIOGEL PI IND STRL 8 (GLOVE) IMPLANT
GOWN STRL REUS W/ TWL LRG LVL3 (GOWN DISPOSABLE) ×16 IMPLANT
HANDLE SUCTION POOLE (INSTRUMENTS) ×2 IMPLANT
IRRIG SUCT STRYKERFLOW 2 WTIP (MISCELLANEOUS) IMPLANT
IRRIGATION SUCT STRKRFLW 2 WTP (MISCELLANEOUS) IMPLANT
KIT BASIN OR (CUSTOM PROCEDURE TRAY) ×2 IMPLANT
KIT COLOSTOMY ILEOSTOMY 4 (WOUND CARE) IMPLANT
KIT SIGMOIDOSCOPE (SET/KITS/TRAYS/PACK) ×2 IMPLANT
KIT TURNOVER KIT B (KITS) ×2 IMPLANT
LEGGING LITHOTOMY PAIR STRL (DRAPES) ×2 IMPLANT
LIGASURE IMPACT 36 18CM CVD LR (INSTRUMENTS) IMPLANT
NS IRRIG 1000ML POUR BTL (IV SOLUTION) ×4 IMPLANT
PAD ARMBOARD POSITIONER FOAM (MISCELLANEOUS) ×4 IMPLANT
PENCIL SMOKE EVACUATOR (MISCELLANEOUS) ×4 IMPLANT
RELOAD GRN CONTOUR (ENDOMECHANICALS) ×4 IMPLANT
RELOAD PROXIMATE TA60MM GREEN (ENDOMECHANICALS) IMPLANT
RELOAD STAPLE 40 GRN THCK (ENDOMECHANICALS) IMPLANT
RELOAD STAPLE 60 4.7 GRN THCK (ENDOMECHANICALS) IMPLANT
RETRACTOR WND ALEXIS 18 MED (MISCELLANEOUS) IMPLANT
RTRCTR WOUND ALEXIS 18CM MED (MISCELLANEOUS) IMPLANT
SCISSORS LAP 5X35 DISP (ENDOMECHANICALS) ×2 IMPLANT
SET TUBE SMOKE EVAC HIGH FLOW (TUBING) ×2 IMPLANT
SHEARS HARMONIC ACE PLUS 36CM (ENDOMECHANICALS) ×2 IMPLANT
SLEEVE Z-THREAD 5X100MM (TROCAR) ×4 IMPLANT
SPECIMEN JAR LARGE (MISCELLANEOUS) ×2 IMPLANT
SPONGE T-LAP 18X18 ~~LOC~~+RFID (SPONGE) IMPLANT
STAPLER CVD CUT GN 40 RELOAD (ENDOMECHANICALS) ×2 IMPLANT
STAPLER CVD CUT GRN 40 RELOAD (ENDOMECHANICALS) IMPLANT
STAPLER PROXIMATE 75MM BLUE (STAPLE) IMPLANT
STAPLER VISISTAT 35W (STAPLE) ×2 IMPLANT
SUCTION POOLE HANDLE (INSTRUMENTS) ×2 IMPLANT
SURGILUBE 2OZ TUBE FLIPTOP (MISCELLANEOUS) ×2 IMPLANT
SUT PDS AB 1 CT 36 (SUTURE) ×4 IMPLANT
SUT PDS AB 1 TP1 96 (SUTURE) IMPLANT
SUT PROLENE 2 0 CT2 30 (SUTURE) IMPLANT
SUT PROLENE 2 0 KS (SUTURE) IMPLANT
SUT PROLENE 2 0 SH 30 (SUTURE) IMPLANT
SUT SILK 2 0 SH CR/8 (SUTURE) ×2 IMPLANT
SUT SILK 2-0 18XBRD TIE 12 (SUTURE) ×2 IMPLANT
SUT SILK 3 0 SH CR/8 (SUTURE) ×2 IMPLANT
SUT SILK 3-0 18XBRD TIE 12 (SUTURE) ×2 IMPLANT
SYR BULB IRRIG 60ML STRL (SYRINGE) ×4 IMPLANT
SYS LAPSCP GELPORT 120MM (MISCELLANEOUS) ×2 IMPLANT
SYSTEM LAPSCP GELPORT 120MM (MISCELLANEOUS) IMPLANT
TOWEL GREEN STERILE (TOWEL DISPOSABLE) ×4 IMPLANT
TRAY FOLEY MTR SLVR 16FR STAT (SET/KITS/TRAYS/PACK) ×2 IMPLANT
TRAY LAPAROSCOPIC MC (CUSTOM PROCEDURE TRAY) ×2 IMPLANT
TROCAR 11X100 Z THREAD (TROCAR) IMPLANT
TROCAR BALLN 12MMX100 BLUNT (TROCAR) IMPLANT
TROCAR Z THREAD OPTICAL 12X100 (TROCAR) IMPLANT
TROCAR Z-THREAD OPTICAL 5X100M (TROCAR) ×2 IMPLANT
TUBE CONNECTING 12X1/4 (SUCTIONS) ×4 IMPLANT
WARMER LAPAROSCOPE (MISCELLANEOUS) ×2 IMPLANT
WATER STERILE IRR 1000ML POUR (IV SOLUTION) ×2 IMPLANT
YANKAUER SUCT BULB TIP NO VENT (SUCTIONS) ×4 IMPLANT

## 2023-12-19 NOTE — Anesthesia Procedure Notes (Signed)
 Procedure Name: Intubation Date/Time: 12/19/2023 10:35 AM  Performed by: Loreda Rodriguez, CRNAPre-anesthesia Checklist: Patient identified, Emergency Drugs available, Suction available and Patient being monitored Patient Re-evaluated:Patient Re-evaluated prior to induction Oxygen Delivery Method: Circle System Utilized Preoxygenation: Pre-oxygenation with 100% oxygen Induction Type: IV induction Ventilation: Mask ventilation without difficulty Laryngoscope Size: Mac and 4 Grade View: Grade I Tube type: Oral Tube size: 7.0 mm Number of attempts: 1 Airway Equipment and Method: Stylet and Oral airway Placement Confirmation: ETT inserted through vocal cords under direct vision, positive ETCO2 and breath sounds checked- equal and bilateral Secured at: 21 cm Tube secured with: Tape Dental Injury: Teeth and Oropharynx as per pre-operative assessment

## 2023-12-19 NOTE — Plan of Care (Signed)
  Problem: Clinical Measurements: Goal: Diagnostic test results will improve 12/19/2023 0147 by Arcadio Knuckles, Onalee Steinbach Anne D, RN Outcome: Not Progressing 12/19/2023 0046 by Arcadio Knuckles, Adel Aden, RN Outcome: Not Progressing   Problem: Nutrition: Goal: Adequate nutrition will be maintained 12/19/2023 0147 by Dela Pena, Zoee Heeney Anne D, RN Outcome: Not Progressing 12/19/2023 0046 by Arcadio Knuckles, Briant Angelillo Anne D, RN Outcome: Not Progressing   Problem: Elimination: Goal: Will not experience complications related to bowel motility 12/19/2023 0147 by Andra Banco, RN Outcome: Not Progressing 12/19/2023 0046 by Arcadio Knuckles, Adel Aden, RN Outcome: Not Progressing

## 2023-12-19 NOTE — Plan of Care (Signed)
 FMTS Interim Progress Note  In to see patient for post op check. Patient reports he is feeling okay. Is having some pain but just got medication about 30 minutes ago. Thinks surgery went well. Otherwise no other complaints.  Blood pressure (!) 147/96, pulse 86, temperature 98.5 F (36.9 C), resp. rate 17, height 5\' 9"  (1.753 m), weight 108.9 kg, SpO2 95%.   78 yo M admitted for constipation and found to have sigmoid stricture. Patient is now post op from colonic resection. Appears to be recovering well.  - Diet: thin liquid - Pain: tylenol 1000mg  q6h, oxycodone 5-10 q4h prn and morphine 2mg  q2h for breakthrough pain - Will restart DVT ppx  - Rest of plan per day team    Farris Hong, MD 12/19/2023, 5:01 PM PGY-1, [facility] Family Medicine Service pager [phone]

## 2023-12-19 NOTE — Op Note (Signed)
 12/19/2023  1:27 PM  PATIENT:  Manuel Morales  78 y.o. male  PRE-OPERATIVE DIAGNOSIS:  SIGMOID COLON STRICTURE  POST-OPERATIVE DIAGNOSIS:  SIGMOID COLON STRICTURE  PROCEDURE:  Procedure(s) with comments: LAPAROSCOPIC ASSISTED SIGMOID COLECTOMY WITH DESCENDING END COLOSTOMY  SURGEON:  Surgeons and Role:    * Caralyn Chandler, MD - Primary    * Lovick, Bard Boor, MD - Assisting  PHYSICIAN ASSISTANT:   ASSISTANTS: Dr. Aniceto Barley   ANESTHESIA:   local and general  EBL:  50 mL   BLOOD ADMINISTERED:none  DRAINS: none   LOCAL MEDICATIONS USED:  MARCAINE     SPECIMEN:  Source of Specimen:  sigmoid colon with stitch on distal staple line  DISPOSITION OF SPECIMEN:  PATHOLOGY  COUNTS:  YES  TOURNIQUET:  * No tourniquets in log *  DICTATION: .Dragon Dictation  After informed consent was obtained the patient was brought to the operating room and placed in the supine position on the operating table.  After adequate induction of general anesthesia the patient was moved in the lithotomy position and all pressure points were padded.  The abdomen was then prepped with ChloraPrep, allowed to dry, and draped in usual sterile manner.  The perineum was prepped with Betadine and also draped in the usual sterile manner.  The area above the umbilicus was infiltrated with quarter percent Marcaine.  A small incision was made with a 15 blade knife.  The incision was carried through the subcutaneous tissue bluntly with a hemostat until the linea alba was identified.  The linea alba was incised with a 15 blade knife.  Each side was grasped with Kocher clamps and elevated anteriorly.  The preperitoneal space was then probed bluntly with a hemostat until the peritoneum was opened and access was gained to the abdominal cavity.  A 0 Vicryl pursestring stitch was placed in the fascia surrounding the opening.  A Hassan cannula was placed through the opening and anchored in place with the previously placed Vicryl  pursestring stitch.  The abdomen was then insufflated with carbon oxide without difficulty.  Two 5 mm ports 1 in the right upper quadrant and 1 in the right lower quadrant were placed under direct vision.  The left colon and sigmoid colon were then mobilized as much as they could by incising the retroperitoneal attachments along the white line of Toldt.  The sigmoid colon was very the contracted and its mesentery and it was difficult to get much of the sigmoid colon mobilized.  After I performed as much mobilization as I could I then made a lower midline incision with a 10 blade knife.  The incision was carried through the skin and subcutaneous tissue sharply with electrocautery.  The linea alba was also incised with the electrocautery and the incision was opened under direct vision.  A wound protector was initially applied but I could not get enough good visualization.  I then moved to a Balfour retractor and packed the small bowel and right colon up into the right.  I was then able to identify the long segment of thickened sigmoid colon.  I chose a spot at the upper rectum where the bowel appeared to be normal.  The mesentery at this point was opened sharply with the electrocautery.  The rectosigmoid at this point was divided with a single firing of a contour green load stapler.  The mesentery to the sigmoid colon was then taken down sharply with the harmonic scalpel.  I then found a point above the chronically  thickened colon where the bowel appeared to be more normal.  This mesentery at this point was opened sharply with the electrocautery.  The colon at this point was divided again with a single firing of a contour green load stapler.  The rest of the mesentery was taken down with the harmonic scalpel.  Once the dissection was complete then the sigmoid colon was removed.  It was marked with a stitch on the distal staple line and sent to pathology.  The remaining proximal colon still had some thickness and  inflammation to it although it did appear to be more healthy.  Because of this I did not feel comfortable trying to connect the colon primarily.  I chose a site in the left mid abdomen for creation of the ostomy.  I removed a circular portion of skin and a core of fatty tissue.  The fascia was then scored in a cruciate manner so that 3 fingers could come through the opening.  I placed a Babcock through the opening and then grasped the staple line of the proximal descending colon.  The colon was then brought through the abdominal wall without difficulty.  The abdomen was then irrigated with copious amounts of saline until the effluent was clear.  All the packs were removed and sponge count was correct.  No other abnormalities were noted on general inspection of the abdomen.  The rectal stump was marked with a 2-0 Prolene stitch.  The fascia of the anterior abdominal wall was then closed with 2 running #1 double-stranded looped PDS sutures.  The subcutaneous tissue was irrigated with copious amounts of saline.  The skin was closed with staples.  A sterile green towel was placed over the midline wound.  The staple line of the ostomy was then excised sharply with the electrocautery.  The ostomy was matured with interrupted 3-0 Vicryl stitches.  An ostomy appliance and sterile dressings were applied.  The patient tolerated the procedure well.  At the end of the case all needle sponge and instrument counts were correct.  The patient was then awakened and taken to recovery in stable condition.  The assistant was instrumental for visualization and completion of the case.  PLAN OF CARE: Admit to inpatient   PATIENT DISPOSITION:  PACU - hemodynamically stable.   Delay start of Pharmacological VTE agent (>24hrs) due to surgical blood loss or risk of bleeding: no

## 2023-12-19 NOTE — Anesthesia Postprocedure Evaluation (Signed)
 Anesthesia Post Note  Patient: Manuel Morales  Procedure(s) Performed: COLECTOMY, SIGMOID, LAPAROSCOPIC CREATION, COLOSTOMY     Patient location during evaluation: PACU Anesthesia Type: General Level of consciousness: awake and alert Pain management: pain level controlled Vital Signs Assessment: post-procedure vital signs reviewed and stable Respiratory status: spontaneous breathing, nonlabored ventilation, respiratory function stable and patient connected to nasal cannula oxygen Cardiovascular status: blood pressure returned to baseline and stable Postop Assessment: no apparent nausea or vomiting Anesthetic complications: no   There were no known notable events for this encounter.  Last Vitals:  Vitals:   12/19/23 1430 12/19/23 1453  BP: (!) 164/96 (!) 147/96  Pulse: 89 86  Resp: 12 17  Temp:  36.9 C  SpO2: 92% 95%    Last Pain:  Vitals:   12/19/23 1508  TempSrc:   PainSc: 3    Pain Goal:                   Leslye Rast

## 2023-12-19 NOTE — Assessment & Plan Note (Addendum)
 2.5 ? 2.7. - Repeat IV potassium 60 mEq, 10 mEq x 6 over the next 6 hours - PM BMP x1 tonight.  - AM BMPs

## 2023-12-19 NOTE — Assessment & Plan Note (Addendum)
 Undergoing resection with Dr. Alethea Andes today.  Will evaluate after surgery. Awaiting biopsy results.  - Restart diet as able. - Continue PRN simethicone for flatulence/gas pain - Continue miralax BID - Vitals and pulse ox

## 2023-12-19 NOTE — Assessment & Plan Note (Addendum)
 149/91 this AM, now SBPs and 160s.   - Aldosterin/renin ratio for primary HTN workup. - Continue amlodipine 10 mg daily - Patient will follow up with family medicine teaching service for outpatient care

## 2023-12-19 NOTE — Anesthesia Preprocedure Evaluation (Addendum)
 Anesthesia Evaluation  Patient identified by MRN, date of birth, ID band Patient awake    Reviewed: Allergy & Precautions, H&P , NPO status , Patient's Chart, lab work & pertinent test results, reviewed documented beta blocker date and time   Airway Mallampati: III       Dental  (+) Edentulous Upper, Edentulous Lower   Pulmonary Current Smoker and Patient abstained from smoking. 15 pack year history    breath sounds clear to auscultation       Cardiovascular hypertension, Pt. 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   Endo/Other  Obesity BMI 35 K 2.7  Renal/GU negative Renal ROS  negative genitourinary   Musculoskeletal  (+) Arthritis , Osteoarthritis,    Abdominal   Peds negative pediatric ROS (+)  Hematology negative hematology ROS (+) Hb 14.2, plt 202   Anesthesia Other Findings   Reproductive/Obstetrics negative OB ROS                             Anesthesia Physical Anesthesia Plan  ASA: 3  Anesthesia Plan: General   Post-op Pain Management: Tylenol PO (pre-op)*   Induction: Intravenous  PONV Risk Score and Plan: Ondansetron, Dexamethasone, Midazolam and Treatment may vary due to age or medical condition  Airway Management Planned: Oral ETT  Additional Equipment: None  Intra-op Plan:   Post-operative Plan: Extubation in OR  Informed Consent:   Plan Discussed with:   Anesthesia Plan Comments:        Anesthesia Quick Evaluation

## 2023-12-19 NOTE — Progress Notes (Signed)
   Daily Progress Note Intern Pager: [phone]  Patient name: Manuel Morales Medical record number: [id number] Date of birth: Jun 29, 1946 Age: 78 y.o. Gender: male  Primary Care Provider: Charle Congo, MD Consultants: GI (signed off), general surgery Code Status: Full  Pt Overview and Major Events to Date:   Manuel Morales is a 78 year old male who presented with constipation and found to have a sigmoid stricture.  Undergoing colonic resection today with Dr. Alethea Andes. Assessment & Plan Sigmoid stricture [facility]) Undergoing resection with Dr. Alethea Andes today.  Will evaluate after surgery. Awaiting biopsy results.  - Restart diet as able. - Continue PRN simethicone for flatulence/gas pain - Continue miralax BID - Vitals and pulse ox Hypokalemia 2.5 ? 2.7. - Repeat IV potassium 60 mEq, 10 mEq x 6 over the next 6 hours - PM BMP x1 tonight.  - AM BMPs HTN (hypertension) 149/91 this AM, now SBPs and 160s.   - Aldosterin/renin ratio for primary HTN workup. - Continue amlodipine 10 mg daily - Patient will follow up with family medicine teaching service for outpatient care Prediabetes A1c 6.1%.  - Will need outpatient follow-up as above Chronic health problem Tobacco use - TOC consult  FEN/GI: NPO PPx: SCDs Dispo: Home pending colonic resection.  Subjective:   On interview, no new concerns.  Patient somewhat anxious about upcoming surgery.  Still having gas pain.  Otherwise, in good spirits considering situation.  Denies headache, vision changes, nausea, vomiting, fevers, chills.  Objective:  BP: 132/77 HR: 68 RR: 17 T: 97.9 O2sat: 100%  Significant vitals over past 24 hours:   Physical Exam:  General: Well-appearing male, NAD, attentive. Cardiovascular: RRR no m/r/g. Respiratory: CTAB. No w/r/r.  Abdomen: No tenderness in 4 quadrants. BS+.  Extremities: ROM intact.  Nonedematous.  Basic labs:  Most recent CBC Lab Results  Component Value Date   WBC 5.9 12/18/2023    HGB 14.2 12/18/2023   HCT 40.1 12/18/2023   MCV 97.8 12/18/2023   PLT 202 12/18/2023   Most recent BMP    Latest Ref Rng & Units 12/19/2023    3:12 AM  BMP  Glucose 70 - 99 mg/dL 147   BUN 8 - 23 mg/dL 5   Creatinine 8.29 - 5.62 mg/dL 1.30   Sodium 865 - 784 mmol/L 139   Potassium 3.5 - 5.1 mmol/L 2.7   Chloride 98 - 111 mmol/L 106   CO2 22 - 32 mmol/L 23   Calcium 8.9 - 10.3 mg/dL 8.8     Other pertinent labs:  Surgical PCR: +Staph   Imaging/Diagnostic Tests:  No new imaging.   Gwyndolyn Lerner, MD 12/19/2023, 7:02 AM  PGY-1, [facility] Family Medicine FPTS Intern pager: [phone], text pages welcome Secure chat group [facility] Teaching Service

## 2023-12-19 NOTE — Assessment & Plan Note (Signed)
 Tobacco use - TOC consult

## 2023-12-19 NOTE — Transfer of Care (Signed)
 Immediate Anesthesia Transfer of Care Note  Patient: Manuel Morales  Procedure(s) Performed: COLECTOMY, SIGMOID, LAPAROSCOPIC CREATION, COLOSTOMY  Patient Location: PACU  Anesthesia Type:General  Level of Consciousness: awake, alert , and oriented  Airway & Oxygen Therapy: Patient Spontanous Breathing and Patient connected to nasal cannula oxygen  Post-op Assessment: Report given to RN and Post -op Vital signs reviewed and stable  Post vital signs: Reviewed and stable  Last Vitals:  Vitals Value Taken Time  BP 165/96 12/19/23 1345  Temp 97   Pulse 89 12/19/23 1345  Resp 22 12/19/23 1345  SpO2 90 % 12/19/23 1345  Vitals shown include unfiled device data.  Last Pain:  Vitals:   12/19/23 0950  TempSrc: Oral  PainSc: 0-No pain         Complications: No notable events documented.

## 2023-12-19 NOTE — Progress Notes (Signed)
 Called Marge of OR to inquire what time is the patient scheduled for OR today, per Marge patient is not booked in their system. Paged DR. Stechschulte to clarify NPO order  and booking disparity, got a call back at at [phone] and per the latter patient doesn't have a time for schedule but Dr. Alethea Andes has a booked OR for the whole day, patient might go midday so he can have his ERAS ensure at 0430 even though he is on Npo post midnight.

## 2023-12-19 NOTE — Progress Notes (Signed)
 3 Days Post-Op   Subjective/Chief Complaint: No complaints. Still feels a little bloated but did bowel prep over weekend   Objective: Vital signs in last 24 hours: Temp:  [97.9 F (36.6 C)-98.5 F (36.9 C)] 97.9 F (36.6 C) (04/14 0736) Pulse Rate:  [63-68] 68 (04/14 0736) Resp:  [17-20] 17 (04/14 0736) BP: (132-160)/(77-89) 132/77 (04/14 0736) SpO2:  [91 %-100 %] 100 % (04/14 0736) Last BM Date : 12/19/23  Intake/Output from previous day: No intake/output data recorded. Intake/Output this shift: No intake/output data recorded.  General appearance: alert and cooperative Resp: clear to auscultation bilaterally Cardio: regular rate and rhythm GI: soft, nontender  Lab Results:  Recent Labs    12/17/23 0435 12/18/23 0833  WBC 5.7 5.9  HGB 13.5 14.2  HCT 38.2* 40.1  PLT 187 202   BMET Recent Labs    12/18/23 0833 12/19/23 0312  NA 138 139  K 2.5* 2.7*  CL 103 106  CO2 24 23  GLUCOSE 127* 110*  BUN 6* 5*  CREATININE 0.90 0.84  CALCIUM 8.8* 8.8*   PT/INR No results for input(s): "LABPROT", "INR" in the last 72 hours. ABG No results for input(s): "PHART", "HCO3" in the last 72 hours.  Invalid input(s): "PCO2", "PO2"  Studies/Results: No results found.  Anti-infectives: Anti-infectives (From admission, onward)    None       Assessment/Plan: s/p Procedure(s): SIGMOIDOSCOPY, FLEXIBLE (N/A) BIOPSY, SKIN, SUBCUTANEOUS TISSUE, OR MUCOUS MEMBRANE Colonic stricture causing obstruction I feel he would benefit from sigmoid colectomy to relieve the obstruction. It is possible he may need a colostomy. I have discussed with him in detail the risks and benefits of the surgery including the risk of leak and he understands and wishes to proceed. Plan for surgery today  LOS: 4 days    Lillette Reid III 12/19/2023

## 2023-12-19 NOTE — Anesthesia Postprocedure Evaluation (Signed)
 Anesthesia Post Note  Patient: Manuel Morales  Procedure(s) Performed: SIGMOIDOSCOPY, FLEXIBLE BIOPSY, SKIN, SUBCUTANEOUS TISSUE, OR MUCOUS MEMBRANE     Patient location during evaluation: PACU Anesthesia Type: MAC Level of consciousness: awake and alert Pain management: pain level controlled Vital Signs Assessment: post-procedure vital signs reviewed and stable Respiratory status: spontaneous breathing, nonlabored ventilation, respiratory function stable and patient connected to nasal cannula oxygen Cardiovascular status: stable and blood pressure returned to baseline Postop Assessment: no apparent nausea or vomiting Anesthetic complications: no   No notable events documented.  Last Vitals:  Vitals:   12/18/23 2023 12/19/23 0503  BP: (!) 145/88 (!) 157/89  Pulse: 63 67  Resp: 17 17  Temp: 36.8 C 36.9 C  SpO2: 94% 99%    Last Pain:  Vitals:   12/19/23 0503  TempSrc: Oral  PainSc:    Pain Goal:                   Leslye Rast

## 2023-12-19 NOTE — Care Management Important Message (Signed)
 Important Message  Patient Details  Name: Manuel Morales MRN: [id number] Date of Birth: 1946-06-03   Important Message Given:  Yes - Medicare IM     Felix Host 12/19/2023, 3:33 PM

## 2023-12-19 NOTE — Assessment & Plan Note (Signed)
 A1c 6.1%.  - Will need outpatient follow-up as above

## 2023-12-20 ENCOUNTER — Encounter (HOSPITAL_COMMUNITY): Payer: Self-pay | Admitting: General Surgery

## 2023-12-20 DIAGNOSIS — E876 Hypokalemia: Secondary | ICD-10-CM | POA: Diagnosis not present

## 2023-12-20 DIAGNOSIS — K6389 Other specified diseases of intestine: Secondary | ICD-10-CM | POA: Diagnosis not present

## 2023-12-20 DIAGNOSIS — R1032 Left lower quadrant pain: Principal | ICD-10-CM

## 2023-12-20 LAB — CBC
HCT: 41.3 % (ref 39.0–52.0)
Hemoglobin: 15 g/dL (ref 13.0–17.0)
MCH: 35.5 pg — ABNORMAL HIGH (ref 26.0–34.0)
MCHC: 36.3 g/dL — ABNORMAL HIGH (ref 30.0–36.0)
MCV: 97.6 fL (ref 80.0–100.0)
Platelets: 209 10*3/uL (ref 150–400)
RBC: 4.23 MIL/uL (ref 4.22–5.81)
RDW: 13.6 % (ref 11.5–15.5)
WBC: 13 10*3/uL — ABNORMAL HIGH (ref 4.0–10.5)
nRBC: 0 % (ref 0.0–0.2)

## 2023-12-20 LAB — BASIC METABOLIC PANEL WITH GFR
Anion gap: 10 (ref 5–15)
BUN: <5 mg/dL — ABNORMAL LOW (ref 8–23)
CO2: 25 mmol/L (ref 22–32)
Calcium: 8.4 mg/dL — ABNORMAL LOW (ref 8.9–10.3)
Chloride: 104 mmol/L (ref 98–111)
Creatinine, Ser: 0.8 mg/dL (ref 0.61–1.24)
GFR, Estimated: >60 mL/min (ref >60)
Glucose, Bld: 140 mg/dL — ABNORMAL HIGH (ref 70–99)
Potassium: 3.7 mmol/L (ref 3.5–5.1)
Sodium: 139 mmol/L (ref 135–145)

## 2023-12-20 MED ORDER — METHOCARBAMOL 500 MG PO TABS
500.0000 mg | ORAL_TABLET | Freq: Four times a day (QID) | ORAL | Status: DC
  Administered 2023-12-20 – 2023-12-24 (×15): 500 mg via ORAL
  Filled 2023-12-20 (×16): qty 1

## 2023-12-20 MED ORDER — ENSURE ENLIVE PO LIQD
237.0000 mL | Freq: Two times a day (BID) | ORAL | Status: DC
  Administered 2023-12-22 (×2): 237 mL via ORAL

## 2023-12-20 MED ORDER — ENOXAPARIN SODIUM 40 MG/0.4ML IJ SOSY
40.0000 mg | PREFILLED_SYRINGE | INTRAMUSCULAR | Status: DC
  Administered 2023-12-20 – 2023-12-24 (×5): 40 mg via SUBCUTANEOUS
  Filled 2023-12-20 (×5): qty 0.4

## 2023-12-20 NOTE — Assessment & Plan Note (Signed)
 170s over 90.  Aldosterone/renin ratio and process - Awaiting aldosterin/renin ratio for primary HTN workup. - Continue amlodipine 10 mg daily 4/16, held for surgery - Patient will follow up with family medicine teaching service for outpatient care

## 2023-12-20 NOTE — Progress Notes (Signed)
 1 Day Post-Op  Subjective: CC: Abdominal pain is well controlled. Tolerating cld without n/v. No ostomy output. Has not been oob. Foley in place with good uop. Hgb stable.   Objective: Vital signs in last 24 hours: Temp:  [98.2 F (36.8 C)-98.8 F (37.1 C)] 98.2 F (36.8 C) (04/15 0747) Pulse Rate:  [83-100] 89 (04/15 1100) Resp:  [11-18] 16 (04/15 1100) BP: (134-190)/(87-96) 155/90 (04/15 1100) SpO2:  [91 %-100 %] 93 % (04/15 1100) Last BM Date : 12/19/23  Intake/Output from previous day: 04/14 0701 - 04/15 0700 In: 1100 [I.V.:800; IV Piggyback:300] Out: 1250 [Urine:1200; Blood:50] Intake/Output this shift: No intake/output data recorded.  PE: Gen:  Alert, NAD, pleasant Abd: Soft, mild distension, appropriately ttp around his incision - also w/ some left hemiabdomen ttp. No rigidity or guarding. +BS. Stoma viable. Ostomy bag with sweat in bag. Midline wound with honeycomb dressing in place - appears to have had some bleeding previously - no active bleeding - if any further bleeding please let our team know. Lap incisions with staples in place, cdi  Lab Results:  Recent Labs    12/18/23 0833 12/20/23 0424  WBC 5.9 13.0*  HGB 14.2 15.0  HCT 40.1 41.3  PLT 202 209   BMET Recent Labs    12/19/23 1705 12/20/23 0424  NA 139 139  K 4.0 3.7  CL 103 104  CO2 24 25  GLUCOSE 130* 140*  BUN 5* <5*  CREATININE 0.94 0.80  CALCIUM 8.7* 8.4*   PT/INR No results for input(s): "LABPROT", "INR" in the last 72 hours. CMP     Component Value Date/Time   NA 139 12/20/2023 0424   K 3.7 12/20/2023 0424   CL 104 12/20/2023 0424   CO2 25 12/20/2023 0424   GLUCOSE 140 (H) 12/20/2023 0424   BUN <5 (L) 12/20/2023 0424   CREATININE 0.80 12/20/2023 0424   CALCIUM 8.4 (L) 12/20/2023 0424   PROT 7.9 12/15/2023 0807   ALBUMIN 4.1 12/15/2023 0807   AST 20 12/15/2023 0807   ALT 17 12/15/2023 0807   ALKPHOS 49 12/15/2023 0807   BILITOT 0.7 12/15/2023 0807   GFRNONAA >60  12/20/2023 0424   GFRAA >90 08/31/2014 0422   Lipase     Component Value Date/Time   LIPASE 27 12/15/2023 0807    Studies/Results: No results found.  Anti-infectives: Anti-infectives (From admission, onward)    Start     Dose/Rate Route Frequency Ordered Stop   12/19/23 1000  ciprofloxacin (CIPRO) IVPB 400 mg        400 mg 200 mL/hr over 60 Minutes Intravenous On call to O.R. 12/19/23 0903 12/19/23 1047   12/19/23 1000  metroNIDAZOLE (FLAGYL) IVPB 500 mg        500 mg 100 mL/hr over 60 Minutes Intravenous On call to O.R. 12/19/23 0903 12/19/23 1047   12/19/23 0945  cefoTEtan (CEFOTAN) 2 g in sodium chloride 0.9 % 100 mL IVPB  Status:  Discontinued        2 g 200 mL/hr over 30 Minutes Intravenous On call to O.R. 12/19/23 0936 12/19/23 1504   12/19/23 0922  sodium chloride 0.9 % with cefoTEtan (CEFOTAN) ADS Med       Note to Pharmacy: Patsi Sears E: cabinet override      12/19/23 0922 12/19/23 1048   12/19/23 0922  metroNIDAZOLE (FLAGYL) 500 MG/100ML IVPB       Note to Pharmacy: Patsi Sears E: cabinet override      12/19/23  4098 12/19/23 1047   12/18/23 1400  neomycin (MYCIFRADIN) tablet 1,000 mg  Status:  Discontinued       Placed in "And" Linked Group   1,000 mg Oral 3 times per day 12/19/23 0936 12/19/23 1236   12/18/23 1400  metroNIDAZOLE (FLAGYL) tablet 1,000 mg  Status:  Discontinued       Placed in "And" Linked Group   1,000 mg Oral 3 times per day 12/19/23 1191 12/19/23 1236        Assessment/Plan POD 1 s/p laparoscopic converted to open sigmoid colectomy with 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 Alethea Andes on 12/19/23 - Path pending.  - Adv to FLD. AROBF before adv further. Add shakes - Multimodal pain control - WOCN consult for new ostomy - Mobilize, PT - Pulm toilet  FEN - FLD, shakes, IVF per primary  VTE - SCDs, Lovenox ID - No further abx needed Foley - Cont. Difficult placement. Plan TOV POD 3 Plan - As above.    LOS: 5  days    Delton Filbert, [facility] 12/20/2023, 11:20 AM Please see Amion for pager number during day hours 7:00am-4:30pm

## 2023-12-20 NOTE — Assessment & Plan Note (Signed)
 Improved after significant repletion, 4.0 ? 3.7 postsurgery. - AM BMPs

## 2023-12-20 NOTE — Plan of Care (Signed)
 ?  Problem: Coping: ?Goal: Level of anxiety will decrease ?Outcome: Progressing ?  ?Problem: Safety: ?Goal: Ability to remain free from injury will improve ?Outcome: Progressing ?  ?

## 2023-12-20 NOTE — Assessment & Plan Note (Signed)
 A1c 6.1%.  - Will need outpatient follow-up as above

## 2023-12-20 NOTE — Consult Note (Signed)
 WOC Nurse ostomy consult note Pt had colostomy surgery performed yesterday.  He states there are no family members that need to be present for teaching since he lives alone.  Current pouch is leaking behind the barrier.  Stoma is red and viable, 1 1/4 inches, located in a valley.   No stool or flatus, small amt pink drainage.  Pt watched in a hand held mirror while pouch change was performed. Applied barrier ring and one piece convex pouch to attempt to maintain a seal. He was able to open and close velcro to empty.   Discussed pouching routines and ordering supplies.  Educational materials left at the bedside and 5 sets of each supply ordered to the room for staff nurses' use.  Use Supplies: barrier ring Timm Foot # [id number] and convex Richardine Chancy # [phone] WOC team will perform another pouch change and teaching session on Thurs or Fri.  Enrolled patient in [employer] DC program: Yes, today. Thank-you,  Wiliam Harder MSN, RN, CWOCN, [place], CNS [phone]

## 2023-12-20 NOTE — Progress Notes (Signed)
 Daily Progress Note Intern Pager: [phone]  Patient name: Manuel Morales Medical record number: [id number] Date of birth: 07-24-1946 Age: 78 y.o. Gender: male  Primary Care Provider: Charle Congo, MD Consultants: GI (signed off), General Surgery. Code Status: Full  Pt Overview and Major Events to Date:   Manuel Morales is a 78 year old male who presented with colonic stricture, status post colectomy 4/14.  Potassium stable postsurgery.  Now needs colostomy bag.  WBC 5.9 ? 13, likely reactive.  Afebrile.  No infectious symptoms clinically.  Will monitor and discharge once surgically cleared. Assessment & Plan Sigmoid stricture (HCC) Underwent resection yesterday.  Vitals within normal limits, setting well on 2 L nasal cannula.  Required ostomy.  Surgical site appears WNL.  Primary patient concern is Foley catheter, which is very uncomfortable for him. Will void trial on POD3. Has not passed gas. - Appreciate surgery recs - Continue pain regimen per surgery including  -IV morphine 2 mg every 2 hours as needed for breakthrough pain  -Oxycodone 5 to 10 mg every 4 hours as needed for moderate pain and severe pain. - WOC consulted for new ostomy, appreciate their work.  - Restart diet and removed Foley catheter as able. - Continue PRN simethicone for flatulence/gas pain - Vitals and pulse ox - AM CBC Hypokalemia Improved after significant repletion, 4.0 ? 3.7 postsurgery. - AM BMPs HTN (hypertension) 170s over 90.  Aldosterone/renin ratio and process - Awaiting aldosterin/renin ratio for primary HTN workup. - Continue amlodipine 10 mg daily 4/16, held for surgery - Patient will follow up with family medicine teaching service for outpatient care Prediabetes A1c 6.1%.  - Will need outpatient follow-up as above Chronic health problem Tobacco use - TOC consult  FEN/GI: Clear liquid diet PPx: Lovenox, scds Dispo: Home, barriers include PO surgery recovery.  Subjective:    On interview, patient is obviously uncomfortable.  Says majority of discomfort is secondary to Foley catheter, and that his surgical site pain is relatively well controlled with comparison.  Feels as if he cannot move without the urge to urinate.  Was unable to sleep last night.  Gas pain has resolved without incident.  Pleasant.  Objective:  BP: 174/90 HR: 85 RR: 18 T: 98.7 O2sat: 92% on room air  Significant vitals over past 24 hours:   Physical Exam:  General: Well-appearing male, sleepy, and some discomfort, Cardiovascular: RRR no m/r/g. Respiratory: CTAB. No w/r/r.  Abdomen: No tenderness in 4 quadrants.  Hypoactive bowel sounds.  Colostomy bag is in place, not erythematous, appears WNL.  No large-volume bleeding at surgical site. Extremities: ROM intact.  Nonedematous.  Basic labs:  Most recent CBC Lab Results  Component Value Date   WBC 13.0 (H) 12/20/2023   HGB 15.0 12/20/2023   HCT 41.3 12/20/2023   MCV 97.6 12/20/2023   PLT 209 12/20/2023   Most recent BMP    Latest Ref Rng & Units 12/20/2023    4:24 AM  BMP  Glucose 70 - 99 mg/dL 147   BUN 8 - 23 mg/dL <5   Creatinine 8.29 - 1.24 mg/dL 5.62   Sodium 130 - 865 mmol/L 139   Potassium 3.5 - 5.1 mmol/L 3.7   Chloride 98 - 111 mmol/L 104   CO2 22 - 32 mmol/L 25   Calcium 8.9 - 10.3 mg/dL 8.4     Other pertinent labs:  Aldosterone/renin ratio: Pending Surgical pathology: In process  Imaging/Diagnostic Tests:  No new imaging  Gwyndolyn Lerner, MD 12/20/2023, 7:30 AM  PGY-1, [facility] Family Medicine FPTS Intern pager: [phone], text pages welcome Secure chat group [facility] Teaching Service

## 2023-12-20 NOTE — Assessment & Plan Note (Addendum)
 Underwent resection yesterday.  Vitals within normal limits, setting well on 2 L nasal cannula.  Required ostomy.  Surgical site appears WNL.  Primary patient concern is Foley catheter, which is very uncomfortable for him. Will void trial on POD3. Has not passed gas. - Appreciate surgery recs - Continue pain regimen per surgery including  -IV morphine 2 mg every 2 hours as needed for breakthrough pain  -Oxycodone 5 to 10 mg every 4 hours as needed for moderate pain and severe pain. - WOC consulted for new ostomy, appreciate their work.  - Restart diet and removed Foley catheter as able. - Continue PRN simethicone for flatulence/gas pain - Vitals and pulse ox - AM CBC

## 2023-12-20 NOTE — Assessment & Plan Note (Signed)
 Tobacco use - TOC consult

## 2023-12-21 DIAGNOSIS — K6389 Other specified diseases of intestine: Secondary | ICD-10-CM | POA: Diagnosis not present

## 2023-12-21 DIAGNOSIS — R1032 Left lower quadrant pain: Secondary | ICD-10-CM | POA: Diagnosis not present

## 2023-12-21 DIAGNOSIS — R0902 Hypoxemia: Secondary | ICD-10-CM | POA: Insufficient documentation

## 2023-12-21 DIAGNOSIS — E876 Hypokalemia: Secondary | ICD-10-CM | POA: Diagnosis not present

## 2023-12-21 LAB — BASIC METABOLIC PANEL WITH GFR
Anion gap: 7 (ref 5–15)
BUN: 10 mg/dL (ref 8–23)
CO2: 28 mmol/L (ref 22–32)
Calcium: 8.7 mg/dL — ABNORMAL LOW (ref 8.9–10.3)
Chloride: 101 mmol/L (ref 98–111)
Creatinine, Ser: 0.86 mg/dL (ref 0.61–1.24)
GFR, Estimated: >60 mL/min (ref >60)
Glucose, Bld: 125 mg/dL — ABNORMAL HIGH (ref 70–99)
Potassium: 3.1 mmol/L — ABNORMAL LOW (ref 3.5–5.1)
Sodium: 136 mmol/L (ref 135–145)

## 2023-12-21 LAB — CBC
HCT: 38.4 % — ABNORMAL LOW (ref 39.0–52.0)
Hemoglobin: 14 g/dL (ref 13.0–17.0)
MCH: 36 pg — ABNORMAL HIGH (ref 26.0–34.0)
MCHC: 36.5 g/dL — ABNORMAL HIGH (ref 30.0–36.0)
MCV: 98.7 fL (ref 80.0–100.0)
Platelets: 213 10*3/uL (ref 150–400)
RBC: 3.89 MIL/uL — ABNORMAL LOW (ref 4.22–5.81)
RDW: 14 % (ref 11.5–15.5)
WBC: 10.2 10*3/uL (ref 4.0–10.5)
nRBC: 0 % (ref 0.0–0.2)

## 2023-12-21 LAB — SURGICAL PATHOLOGY

## 2023-12-21 MED ORDER — POTASSIUM CHLORIDE CRYS ER 20 MEQ PO TBCR
40.0000 meq | EXTENDED_RELEASE_TABLET | Freq: Once | ORAL | Status: AC
  Administered 2023-12-21: 40 meq via ORAL
  Filled 2023-12-21: qty 2

## 2023-12-21 NOTE — TOC Initial Note (Signed)
 Transition of Care [facility]) - Initial/Assessment Note    Patient Details  Name: Manuel Morales MRN: [id number] Date of Birth: 1945/09/08  [facility] CM/SW Contact:    Lawerance Sabal, RN Phone Number: 12/21/2023, 11:10 AM  Clinical Narrative:                  Spoke w patient at bedside.  He states that he lives in apatment with roommate. Both he and roommate drive. He does not have a car but can get rides for follow ups.  He has a new ostomy. He is in the process of learning how to care for it at home. He understands that there will not be help available to help him at home for every change and he states he will be able to learn how.  No needs identified at this time  Expected Discharge Plan: Home/Self Care Barriers to Discharge: Continued Medical Work up   Patient Goals and CMS Choice Patient states their goals for this hospitalization and ongoing recovery are:: to go home          Expected Discharge Plan and Services   Discharge Planning Services: CM Consult   Living arrangements for the past 2 months: Apartment                                      Prior Living Arrangements/Services Living arrangements for the past 2 months: Apartment Lives with:: Roommate                   Activities of Daily Living   ADL Screening (condition at time of admission) Independently performs ADLs?: Yes (appropriate for developmental age) Is the patient deaf or have difficulty hearing?: No Does the patient have difficulty seeing, even when wearing glasses/contacts?: No Does the patient have difficulty concentrating, remembering, or making decisions?: No  Permission Sought/Granted                  Emotional Assessment              Admission diagnosis:  Colonic mass [K63.89] Left lower quadrant abdominal pain [R10.32] Patient Active Problem List   Diagnosis Date Noted   Left lower quadrant abdominal pain 12/20/2023   Prediabetes 12/16/2023    Sigmoid stricture (HCC) 12/16/2023   Diverticulosis of colon with hemorrhage 12/16/2023   Colonic mass 12/15/2023   Hypokalemia 12/15/2023   HTN (hypertension) 12/15/2023   Chronic health problem 12/15/2023   Diverticulitis of large intestine without perforation or abscess without bleeding    Diverticulitis 08/27/2014   SBO (small bowel obstruction) (HCC) 08/27/2014   Tobacco use disorder 08/27/2014   PCP:  Fleet Contras, MD Pharmacy:   [id number] - [place], [place] - [address] [place] [address] Phone: [phone] Fax: [phone]     Social Drivers of Health (SDOH) Social History: SDOH Screenings   Food Insecurity: No Food Insecurity (12/15/2023)  Housing: Low Risk  (12/15/2023)  Transportation Needs: No Transportation Needs (12/15/2023)  Utilities: Not At Risk (12/15/2023)  Social Connections: Moderately Isolated (12/15/2023)  Tobacco Use: High Risk (12/19/2023)   SDOH Interventions:     Readmission Risk Interventions     No data to display

## 2023-12-21 NOTE — Assessment & Plan Note (Addendum)
 118/73.  Asymptomatic.  Says spikes and blood pressure likely due to pain on movement.  Aldosterone/renin ratio in process. - Continue amlodipine 10 mg daily  - Awaiting aldosterin/renin ratio for primary HTN workup. - Patient will follow up with family medicine teaching service for outpatient care

## 2023-12-21 NOTE — Progress Notes (Signed)
 Daily Progress Note Intern Pager: [phone]  Patient name: Manuel Morales Medical record number: [id number] Date of birth: 1945/10/17 Age: 78 y.o. 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  Status post resection 04/14. POD2.  Patient feels somewhat better than yesterday. Still has not passed flatus, causing discomfort.  PT consult placed for ambulation.  No ongoing drainage. Some concern for Foley blockage. Flush and bladder scan today.   Assessment & Plan Sigmoid stricture (HCC) Has not passed flatus.  PT consult ordered.  Ostomy and wound stable, for surgery. - Appreciate surgery recs - PT consult to encourage ambulation - Vitals and pulse ox - Continue pain regimen per surgery including  -IV morphine 2 mg every 2 hours as needed for breakthrough pain  -Oxycodone 5 to 10 mg every 4 hours as needed for moderate pain and severe pain. - WOC consulted for new ostomy, appreciate their work.  - Restart diet and removed Foley catheter as able. - Continue PRN simethicone for flatulence/gas pain - AM CBC Oxygen desaturation Desaturation to 86% on room air this a.m. Conversing normally, no increased work breathing.  No subjective shortness of breath.  No chest pain.  We will place pulse ox and continue to monitor. - Place pulse ox - O2 goal 88% - 92% Hypokalemia 3.1 this a.m. - PO potassium 40 mEq x1 - AM BMPs HTN (hypertension) 118/73.  Asymptomatic.  Says spikes and blood pressure likely due to pain on movement.  Aldosterone/renin ratio in process. - Continue amlodipine 10 mg daily  - Awaiting aldosterin/renin ratio for primary HTN workup. - Patient will follow up with family medicine teaching service for outpatient care Prediabetes A1c 6.1%.  - Will need outpatient  follow-up as above Chronic health problem Tobacco use - TOC consult  FEN/GI: Full liquid PPx: Lovenox Dispo: Home.  Barriers include postsurgery recovery.  Subjective:   On interview, patient feels somewhat better, when sitting still.  Still has not passed gas through ostomy.  No new concerns.  No headache or vision changes.  Would prefer to leave the hospital, but is willing to stay in order to finish appropriate care.  Objective:  BP: 118/73 HR: 88 RR: 17 T: 97.3 O2sat: 86% on room air  Significant vitals over past 24 hours: Satting in the low 90s on 2 L nasal cannula  Physical Exam:  General: Well-appearing male, NAD, attentive. Cardiovascular: RRR no m/r/g. Respiratory: CTAB. No w/r/r.  Abdomen: No tenderness in 4 quadrants. BS+.  No gross drainage.  No erythema noted. Extremities: ROM intact.  Nonedematous.  Basic labs:  Most recent CBC Lab Results  Component Value Date   WBC 10.2 12/21/2023   HGB 14.0 12/21/2023   HCT 38.4 (L) 12/21/2023   MCV 98.7 12/21/2023   PLT 213 12/21/2023   Most recent BMP    Latest Ref Rng & Units 12/21/2023    6:15 AM  BMP  Glucose 70 - 99 mg/dL 130   BUN 8 - 23 mg/dL 10   Creatinine 8.65 - 1.24 mg/dL 7.84   Sodium 696 - 295 mmol/L 136   Potassium 3.5 - 5.1 mmol/L 3.1   Chloride 98 - 111 mmol/L 101   CO2 22 - 32 mmol/L 28   Calcium 8.9 - 10.3 mg/dL 8.7     Other pertinent labs:  None  Imaging/Diagnostic Tests:  No new imaging  Gwyndolyn Lerner, MD 12/21/2023, 7:31 AM  PGY-1, [facility] Family Medicine FPTS Intern pager: [phone], text pages welcome Secure chat group [facility] Teaching Service

## 2023-12-21 NOTE — Progress Notes (Addendum)
 2 Days Post-Op  Subjective: CC: Having some bloating and incisional pain with movement but no pain at rest. Tolerating fld without n/v. No ostomy output. Has not been oob. Foley in place. No I/O documented. Pt reports leaking this am. Afebrile. WBC wnl. Hgb stable.   Objective: Vital signs in last 24 hours: Temp:  [97.3 F (36.3 C)-98.6 F (37 C)] 97.6 F (36.4 C) (04/16 0700) Pulse Rate:  [73-94] 73 (04/16 0700) Resp:  [16-17] 17 (04/16 0700) BP: (118-159)/(73-90) 121/85 (04/16 0700) SpO2:  [86 %-95 %] 95 % (04/16 0700) Last BM Date : 12/19/23  Intake/Output from previous day: 04/15 0701 - 04/16 0700 In: 240 [P.O.:240] Out: -  Intake/Output this shift: No intake/output data recorded.  PE: Gen:  Alert, NAD, pleasant Abd: Soft, mild distension, appropriately ttp around his incision - also w/ some left hemiabdomen ttp. No rigidity or guarding. +BS. Stoma viable. Ostomy bag with sweat in bag. Midline wound with honeycomb dressing in place - appears to have had some bleeding previously - no active bleeding - if any further bleeding please let our team know. Lap incisions with staples in place, cdi GU: Foley in place draining straw colored urine.   Lab Results:  Recent Labs    12/20/23 0424 12/21/23 0615  WBC 13.0* 10.2  HGB 15.0 14.0  HCT 41.3 38.4*  PLT 209 213   BMET Recent Labs    12/20/23 0424 12/21/23 0615  NA 139 136  K 3.7 3.1*  CL 104 101  CO2 25 28  GLUCOSE 140* 125*  BUN <5* 10  CREATININE 0.80 0.86  CALCIUM 8.4* 8.7*   PT/INR No results for input(s): "LABPROT", "INR" in the last 72 hours. CMP     Component Value Date/Time   NA 136 12/21/2023 0615   K 3.1 (L) 12/21/2023 0615   CL 101 12/21/2023 0615   CO2 28 12/21/2023 0615   GLUCOSE 125 (H) 12/21/2023 0615   BUN 10 12/21/2023 0615   CREATININE 0.86 12/21/2023 0615   CALCIUM 8.7 (L) 12/21/2023 0615   PROT 7.9 12/15/2023 0807   ALBUMIN 4.1 12/15/2023 0807   AST 20 12/15/2023 0807   ALT  17 12/15/2023 0807   ALKPHOS 49 12/15/2023 0807   BILITOT 0.7 12/15/2023 0807   GFRNONAA >60 12/21/2023 0615   GFRAA >90 08/31/2014 0422   Lipase     Component Value Date/Time   LIPASE 27 12/15/2023 0807    Studies/Results: No results found.  Anti-infectives: Anti-infectives (From admission, onward)    Start     Dose/Rate Route Frequency Ordered Stop   12/19/23 1000  ciprofloxacin (CIPRO) IVPB 400 mg        400 mg 200 mL/hr over 60 Minutes Intravenous On call to O.R. 12/19/23 0903 12/19/23 1047   12/19/23 1000  metroNIDAZOLE (FLAGYL) IVPB 500 mg        500 mg 100 mL/hr over 60 Minutes Intravenous On call to O.R. 12/19/23 0903 12/19/23 1047   12/19/23 0945  cefoTEtan (CEFOTAN) 2 g in sodium chloride 0.9 % 100 mL IVPB  Status:  Discontinued        2 g 200 mL/hr over 30 Minutes Intravenous On call to O.R. 12/19/23 0936 12/19/23 1504   12/19/23 0922  sodium chloride 0.9 % with cefoTEtan (CEFOTAN) ADS Med       Note to Pharmacy: Patsi Sears E: cabinet override      12/19/23 0922 12/19/23 1048   12/19/23 0922  metroNIDAZOLE (FLAGYL) 500  MG/100ML IVPB       Note to Pharmacy: Geraldyne Kluver E: cabinet override      12/19/23 1610 12/19/23 1047   12/18/23 1400  neomycin (MYCIFRADIN) tablet 1,000 mg  Status:  Discontinued       Placed in "And" Linked Group   1,000 mg Oral 3 times per day 12/19/23 0936 12/19/23 1236   12/18/23 1400  metroNIDAZOLE (FLAGYL) tablet 1,000 mg  Status:  Discontinued       Placed in "And" Linked Group   1,000 mg Oral 3 times per day 12/19/23 9604 12/19/23 1236        Assessment/Plan POD 2 s/p laparoscopic converted to open sigmoid colectomy with 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 Alethea Andes on 12/19/23 - Path pending.  - Cont FLD + shakes. AROBF before adv further.  - Multimodal pain control - WOCN consult for new ostomy - Mobilize, PT - Pulm toilet  FEN - FLD, shakes, IVF per primary  VTE - SCDs, Lovenox ID - No further  abx needed Foley - Difficult placement. Plan TOV POD 3. No IO documented overnight and leaking around it per pt report - c/f it being clogged. Asked RN to flush foley and check bladder scan.  Plan - As above.    LOS: 6 days    Delton Filbert, [facility] Surgery 12/21/2023, 10:18 AM Please see Amion for pager number during day hours 7:00am-4:30pm

## 2023-12-21 NOTE — Evaluation (Signed)
 Physical Therapy Evaluation Patient Details Name: Manuel Morales MRN: [id number] DOB: 1946/01/06 Today's Date: 12/21/2023  History of Present Illness  Patient is 78 y.o. male s/p laparoscopic converted to open sigmoid colectomy with 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 Carolynne Edouard on 12/19/23. PMH significant for OA, HTN, diverticulosis.  Clinical Impression  Manuel Morales is 78 y.o. male admitted with above HPI and diagnosis. Patient is currently limited by functional impairments below (see PT problem list). Patient lives with godson and is independent with no AD at baseline. Currently pt limited by abdominal pain secondary to surgery and gas, pt required CGA for safety with transfers and gait and RW provides relief for abdominal discomfort with mobility. Pt amb ~150' with RW and no overt LOB, 4 standing rest breaks due to discomfort. Patient will benefit from continued skilled PT interventions to address impairments and progress independence with mobility, recommending HH services, will update recs as appropriate. Acute PT will follow and progress as able.         If plan is discharge home, recommend the following: A little help with walking and/or transfers;A little help with bathing/dressing/bathroom;Assist for transportation;Help with stairs or ramp for entrance   Can travel by private vehicle        Equipment Recommendations Rolling walker (2 wheels);BSC/3in1 (pending progress)  Recommendations for Other Services       Functional Status Assessment Patient has had a recent decline in their functional status and demonstrates the ability to make significant improvements in function in a reasonable and predictable amount of time.     Precautions / Restrictions Precautions Precautions: Fall Recall of Precautions/Restrictions: Intact Restrictions Weight Bearing Restrictions Per Provider Order: No      Mobility  Bed Mobility               General bed  mobility comments: pt OOB in recliner    Transfers Overall transfer level: Needs assistance Equipment used: Rolling walker (2 wheels) Transfers: Sit to/from Stand Sit to Stand: Contact guard assist           General transfer comment: CGA for safety, pt reliant on bil UE for power up and to control lowering    Ambulation/Gait Ambulation/Gait assistance: Contact guard assist Gait Distance (Feet): 150 Feet Assistive device: Rolling walker (2 wheels) Gait Pattern/deviations: Step-through pattern, Decreased stride length, Trunk flexed Gait velocity: decr     General Gait Details: posture flexed due to abdominal pain  Stairs            Wheelchair Mobility     Tilt Bed    Modified Rankin (Stroke Patients Only)       Balance Overall balance assessment: Needs assistance Sitting-balance support: Feet supported Sitting balance-Leahy Scale: Good     Standing balance support: During functional activity, Bilateral upper extremity supported Standing balance-Leahy Scale: Fair                               Pertinent Vitals/Pain Pain Assessment Pain Assessment: 0-10 Pain Score: 3  Pain Location: abdominal discomfort Pain Descriptors / Indicators: Discomfort Pain Intervention(s): Limited activity within patient's tolerance, Monitored during session, Repositioned    Home Living Family/patient expects to be discharged to:: Private residence Living Arrangements: Alone;Other relatives (god son) Available Help at Discharge: Family Type of Home: House Home Access: Stairs to enter Entrance Stairs-Rails: None Entrance Stairs-Number of Steps: 2 Alternate Level Stairs-Number of Steps: 10-16 Home Layout: Two level;Bed/bath upstairs;1/2  bath on main level Home Equipment: None      Prior Function Prior Level of Function : Driving;Independent/Modified Independent                     Extremity/Trunk Assessment   Upper Extremity Assessment Upper  Extremity Assessment: Overall WFL for tasks assessed    Lower Extremity Assessment Lower Extremity Assessment: Overall WFL for tasks assessed    Cervical / Trunk Assessment Cervical / Trunk Assessment: Normal  Communication   Communication Communication: No apparent difficulties    Cognition Arousal: Alert Behavior During Therapy: WFL for tasks assessed/performed   PT - Cognitive impairments: No apparent impairments                         Following commands: Intact       Cueing Cueing Techniques: Verbal cues     General Comments      Exercises     Assessment/Plan    PT Assessment Patient needs continued PT services  PT Problem List Decreased activity tolerance;Decreased balance;Decreased mobility;Decreased knowledge of use of DME;Decreased safety awareness;Decreased knowledge of precautions;Obesity;Pain       PT Treatment Interventions DME instruction;Patient/family education;Balance training;Therapeutic exercise;Therapeutic activities;Functional mobility training;Stair training;Gait training    PT Goals (Current goals can be found in the Care Plan section)  Acute Rehab PT Goals Patient Stated Goal: recover, stop hurting, get home PT Goal Formulation: With patient Time For Goal Achievement: 01/04/24 Potential to Achieve Goals: Good    Frequency Min 2X/week     Co-evaluation               AM-PAC PT "6 Clicks" Mobility  Outcome Measure Help needed turning from your back to your side while in a flat bed without using bedrails?: A Little Help needed moving from lying on your back to sitting on the side of a flat bed without using bedrails?: A Little Help needed moving to and from a bed to a chair (including a wheelchair)?: A Little Help needed standing up from a chair using your arms (e.g., wheelchair or bedside chair)?: A Little Help needed to walk in hospital room?: A Little Help needed climbing 3-5 steps with a railing? : A Little 6 Click  Score: 18    End of Session Equipment Utilized During Treatment: Gait belt Activity Tolerance: Patient tolerated treatment well Patient left: in chair;with call bell/phone within reach Nurse Communication: Mobility status PT Visit Diagnosis: Difficulty in walking, not elsewhere classified (R26.2);Pain Pain - part of body:  (abdomen)    Time: 1610-9604 PT Time Calculation (min) (ACUTE ONLY): 22 min   Charges:   PT Evaluation $PT Eval Low Complexity: 1 Low   PT General Charges $$ ACUTE PT VISIT: 1 Visit         Tish Forge, DPT Acute Rehabilitation Services Office [phone]  12/21/23 4:26 PM

## 2023-12-21 NOTE — Assessment & Plan Note (Signed)
 Desaturation to 86% on room air this a.m. Conversing normally, no increased work breathing.  No subjective shortness of breath.  No chest pain.  We will place pulse ox and continue to monitor. - Place pulse ox - O2 goal 88% - 92%

## 2023-12-21 NOTE — Assessment & Plan Note (Addendum)
 Has not passed flatus.  PT consult ordered.  Ostomy and wound stable, for surgery. - Appreciate surgery recs - PT consult to encourage ambulation - Vitals and pulse ox - Continue pain regimen per surgery including  -IV morphine 2 mg every 2 hours as needed for breakthrough pain  -Oxycodone 5 to 10 mg every 4 hours as needed for moderate pain and severe pain. - WOC consulted for new ostomy, appreciate their work.  - Restart diet and removed Foley catheter as able. - Continue PRN simethicone for flatulence/gas pain - AM CBC

## 2023-12-21 NOTE — Assessment & Plan Note (Signed)
 A1c 6.1%.  - Will need outpatient follow-up as above

## 2023-12-21 NOTE — Plan of Care (Signed)

## 2023-12-21 NOTE — Assessment & Plan Note (Signed)
 3.1 this a.m. - PO potassium 40 mEq x1 - AM BMPs

## 2023-12-21 NOTE — Assessment & Plan Note (Signed)
 Tobacco use - TOC consult

## 2023-12-22 DIAGNOSIS — K56699 Other intestinal obstruction unspecified as to partial versus complete obstruction: Secondary | ICD-10-CM | POA: Diagnosis not present

## 2023-12-22 DIAGNOSIS — Z9049 Acquired absence of other specified parts of digestive tract: Secondary | ICD-10-CM

## 2023-12-22 LAB — BASIC METABOLIC PANEL WITH GFR
Anion gap: 10 (ref 5–15)
BUN: 9 mg/dL (ref 8–23)
CO2: 27 mmol/L (ref 22–32)
Calcium: 8.9 mg/dL (ref 8.9–10.3)
Chloride: 102 mmol/L (ref 98–111)
Creatinine, Ser: 0.78 mg/dL (ref 0.61–1.24)
GFR, Estimated: >60 mL/min (ref >60)
Glucose, Bld: 153 mg/dL — ABNORMAL HIGH (ref 70–99)
Potassium: 3 mmol/L — ABNORMAL LOW (ref 3.5–5.1)
Sodium: 139 mmol/L (ref 135–145)

## 2023-12-22 LAB — CBC
HCT: 38 % — ABNORMAL LOW (ref 39.0–52.0)
Hemoglobin: 13.4 g/dL (ref 13.0–17.0)
MCH: 35.1 pg — ABNORMAL HIGH (ref 26.0–34.0)
MCHC: 35.3 g/dL (ref 30.0–36.0)
MCV: 99.5 fL (ref 80.0–100.0)
Platelets: 218 10*3/uL (ref 150–400)
RBC: 3.82 MIL/uL — ABNORMAL LOW (ref 4.22–5.81)
RDW: 13.9 % (ref 11.5–15.5)
WBC: 7.5 10*3/uL (ref 4.0–10.5)
nRBC: 0 % (ref 0.0–0.2)

## 2023-12-22 LAB — MAGNESIUM: Magnesium: 1.8 mg/dL (ref 1.7–2.4)

## 2023-12-22 MED ORDER — POTASSIUM CHLORIDE CRYS ER 20 MEQ PO TBCR
60.0000 meq | EXTENDED_RELEASE_TABLET | Freq: Once | ORAL | Status: AC
  Administered 2023-12-22: 60 meq via ORAL
  Filled 2023-12-22: qty 3

## 2023-12-22 NOTE — Evaluation (Signed)
 Occupational Therapy Evaluation Patient Details Name: Manuel Morales MRN: [id number] DOB: 1946-08-07 Today's Date: 12/22/2023   History of Present Illness   Patient is 78 y.o. male s/p laparoscopic converted to open sigmoid colectomy with 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 Alethea Andes on 12/19/23. PMH significant for OA, HTN, diverticulosis.     Clinical Impressions Pt admitted based on above, and was seen based on problem list below. PTA pt was living with his godson and was  independent with ADLs and IADLs. Today pt is requiring set up  to s for safety for ADLs. Functional transfers are  s for safety with RW. Anticipate No follow up OT  or DME needs. OT will continue to follow acutely to maximize functional independence.        If plan is discharge home, recommend the following:   A little help with walking and/or transfers;A little help with bathing/dressing/bathroom;Help with stairs or ramp for entrance     Functional Status Assessment   Patient has had a recent decline in their functional status and demonstrates the ability to make significant improvements in function in a reasonable and predictable amount of time.     Equipment Recommendations   None recommended by OT     Recommendations for Other Services         Precautions/Restrictions   Precautions Precautions: Fall Recall of Precautions/Restrictions: Intact     Mobility Bed Mobility Overal bed mobility: Modified Independent     General bed mobility comments: HOB elevated    Transfers Overall transfer level: Needs assistance Equipment used: Rolling walker (2 wheels) Transfers: Sit to/from Stand, Bed to chair/wheelchair/BSC Sit to Stand: Supervision     Step pivot transfers: Supervision     General transfer comment: S for safety      Balance Overall balance assessment: Needs assistance Sitting-balance support: Feet supported Sitting balance-Leahy Scale: Normal      Standing balance support: During functional activity, Bilateral upper extremity supported Standing balance-Leahy Scale: Good         ADL either performed or assessed with clinical judgement   ADL Overall ADL's : Needs assistance/impaired Eating/Feeding: Set up;Sitting   Grooming: Oral care;Wash/dry face;Brushing hair;Supervision/safety;Standing     Upper Body Dressing : Set up;Sitting   Lower Body Dressing: Set up;Sit to/from stand Lower Body Dressing Details (indicate cue type and reason): Pt able to reach bil LEs seated by crossing feet Toilet Transfer: Supervision/safety;Ambulation;Rolling walker (2 wheels) Toilet Transfer Details (indicate cue type and reason): Simulated in room         Functional mobility during ADLs: Supervision/safety;Rolling walker (2 wheels)       Vision Baseline Vision/History: 0 No visual deficits Vision Assessment?: No apparent visual deficits            Pertinent Vitals/Pain Pain Assessment Pain Assessment: No/denies pain Pain Intervention(s): Repositioned, Monitored during session     Extremity/Trunk Assessment Upper Extremity Assessment Upper Extremity Assessment: Overall WFL for tasks assessed   Lower Extremity Assessment Lower Extremity Assessment: Defer to PT evaluation   Cervical / Trunk Assessment Cervical / Trunk Assessment: Normal   Communication Communication Communication: No apparent difficulties   Cognition Arousal: Alert Behavior During Therapy: WFL for tasks assessed/performed Cognition: No apparent impairments       Following commands: Intact       Cueing  General Comments   Cueing Techniques: Verbal cues  VSS on RA           Home Living Family/patient expects to be  discharged to:: Private residence Living Arrangements: Alone;Other relatives (god son) Available Help at Discharge: Family;Available PRN/intermittently Type of Home: Apartment Home Access: Stairs to enter Entrance Stairs-Number  of Steps: 2 Entrance Stairs-Rails: None Home Layout: Two level;Bed/bath upstairs;1/2 bath on main level Alternate Level Stairs-Number of Steps: 10-16 Alternate Level Stairs-Rails: Left Bathroom Shower/Tub: Chief Strategy Officer: Standard Bathroom Accessibility: Yes How Accessible: Accessible via walker Home Equipment: Grab bars - tub/shower          Prior Functioning/Environment Prior Level of Function : Driving;Independent/Modified Independent     Mobility Comments: No AD      OT Problem List: Decreased strength;Decreased activity tolerance;Decreased knowledge of use of DME or AE   OT Treatment/Interventions: Self-care/ADL training;Therapeutic exercise;Energy conservation;DME and/or AE instruction;Therapeutic activities;Patient/family education;Balance training      OT Goals(Current goals can be found in the care plan section)   Acute Rehab OT Goals Patient Stated Goal: To go home OT Goal Formulation: With patient Time For Goal Achievement: 01/05/24 Potential to Achieve Goals: Good   OT Frequency:  Min 2X/week       AM-PAC OT "6 Clicks" Daily Activity     Outcome Measure Help from another person eating meals?: None Help from another person taking care of personal grooming?: A Little Help from another person toileting, which includes using toliet, bedpan, or urinal?: A Little Help from another person bathing (including washing, rinsing, drying)?: A Little Help from another person to put on and taking off regular upper body clothing?: A Little Help from another person to put on and taking off regular lower body clothing?: A Little 6 Click Score: 19   End of Session Equipment Utilized During Treatment: Rolling walker (2 wheels) Nurse Communication: Mobility status  Activity Tolerance: Patient tolerated treatment well Patient left: in chair;with call bell/phone within reach  OT Visit Diagnosis: Other abnormalities of gait and mobility (R26.89);Muscle  weakness (generalized) (M62.81)                Time: 9604-5409 OT Time Calculation (min): 21 min Charges:  OT General Charges $OT Visit: 1 Visit OT Evaluation $OT Eval Low Complexity: 1 Low  Delmer Ferraris, OT  Acute Rehabilitation Services Office [phone] Secure chat preferred   Mickael Alamo 12/22/2023, 9:49 AM

## 2023-12-22 NOTE — Progress Notes (Signed)
 Daily Progress Note Intern Pager: [phone]  Patient name: Manuel Morales Medical record number: [id number] Date of birth: [birth year] Age: 78 y.o. 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 Status-post resection 4/14. POD3. Worked with PT yesterday, now passing flatus, relieving bowel discomfort. Surgical wound and ostomy well-appearing. Appreciate general surgery recs concerning advancing diet. Voiding trial today.  Assessment & Plan Sigmoid stricture [facility]) Pathology negative. Passing flatus with PT yesterday. Can move around with walker. Will work with PT again today. Still spilling urine around catheter. Voiding trial today.  - Appreciate surgery recs - PT consult, very much appreciate their help with this patient.  - Vitals and pulse ox - Continue pain regimen per surgery including  -IV morphine 2 mg every 2 hours as needed for breakthrough pain  -Oxycodone 5 to 10 mg every 4 hours as needed for moderate pain and severe pain. - WOC consulted for new ostomy, appreciate their work.  - Advance diet as able, soft diet today.  - Continue PRN simethicone for flatulence/gas pain - AM CBC HTN (hypertension) 153/87. Asymptomatic. - Continue amlodipine 10 mg daily  - Awaiting aldosterin/renin ratio for primary HTN workup. - Patient will follow up with family medicine teaching service for outpatient care Oxygen desaturation Saturating well on room air. 90%-97% on RA.  - Place pulse ox - O2 goal 88% - 92% Hypokalemia 3.0 this AM. - PO potassium 60 mEq x1 today. - Add-on magnesium lab - AM BMPs Prediabetes A1c 6.1%.  - Will need outpatient follow-up as above Chronic health problem Tobacco use - TOC consult  FEN/GI: Soft PPx: Lovenox Dispo: Home, post-surgery  recovery  Subjective:   On interview, no new concerns.  Abdominal pain relieved after passing gas yesterday with PT.  Still he could drown Foley catheter.  Void trial today.  Anticipates leaving the hospital as soon as possible.  Relieved to hear that pathology negative for pregnancy.  Objective:  BP: 135/67 HR: 72 RR: 17 T: 98.7 O2sat: 97% on RA  Significant vitals over past 24 hours:   Physical Exam:  General: Well-appearing male, NAD, attentive. Cardiovascular: RRR no m/r/g. Respiratory: CTAB. No w/r/r.  Abdomen: No tenderness in 4 quadrants. BS+.  Ostomy patent.  Surgical wound shows no new bleeding.  Erythema, drainage. Extremities: ROM intact.  Nonedematous.  Basic labs:  Most recent CBC Lab Results  Component Value Date   WBC 7.5 12/22/2023   HGB 13.4 12/22/2023   HCT 38.0 (L) 12/22/2023   MCV 99.5 12/22/2023   PLT 218 12/22/2023   Most recent BMP    Latest Ref Rng & Units 12/21/2023    6:15 AM  BMP  Glucose 70 - 99 mg/dL 956   BUN 8 - 23 mg/dL 10   Creatinine 2.13 - 1.24 mg/dL 0.86   Sodium 578 - 469 mmol/L 136   Potassium 3.5 - 5.1 mmol/L 3.1   Chloride 98 - 111 mmol/L 101   CO2 22 - 32 mmol/L 28   Calcium 8.9 - 10.3 mg/dL 8.7     Other pertinent labs:  Potassium: 3 HgB: 14.0 --> 13.4.   Imaging/Diagnostic Tests:  None new.  Tomie China, MD 12/22/2023, 7:25 AM  PGY-1, [facility] Family Medicine FPTS Intern pager: [phone], text pages welcome Secure chat group [facility] Teaching Service

## 2023-12-22 NOTE — Assessment & Plan Note (Addendum)
 153/87. Asymptomatic. - Continue amlodipine 10 mg daily  - Awaiting aldosterin/renin ratio for primary HTN workup. - Patient will follow up with family medicine teaching service for outpatient care

## 2023-12-22 NOTE — Progress Notes (Signed)
 3 Days Post-Op  Subjective: CC: Having incisional pain with movement but no pain at rest. Feels pain is well controlled. Tolerating fld without n/v. Passing flatus via ostomy,  no stool yet. Has been oob. Up to the chair this am. Foley in place - UOP 0.4 ml/kg/hr over the last 24 hours, clear yellow urine. Afebrile. WBC wnl. Hgb stable.   Objective: Vital signs in last 24 hours: Temp:  [97.8 F (36.6 C)-98.7 F (37.1 C)] 97.8 F (36.6 C) (04/17 0841) Pulse Rate:  [70-85] 72 (04/17 0841) Resp:  [17-18] 18 (04/17 0841) BP: (135-153)/(67-89) 153/87 (04/17 0841) SpO2:  [90 %-97 %] 95 % (04/17 0841) Last BM Date : 12/20/23  Intake/Output from previous day: 04/16 0701 - 04/17 0700 In: -  Out: 500 [Urine:500] Intake/Output this shift: No intake/output data recorded.  PE: Gen:  Alert, NAD, pleasant Abd: Soft, mild distension, appropriately ttp around his incision. No rigidity or guarding. +BS. Stoma viable. Ostomy bag with sweat in bag. Midline wound with honeycomb dressing in place - appears to have had some bleeding previously - no active bleeding - if any further bleeding please let our team know. Lap incisions with staples in place, cdi GU: Foley in place draining straw colored urine.   Lab Results:  Recent Labs    12/21/23 0615 12/22/23 0543  WBC 10.2 7.5  HGB 14.0 13.4  HCT 38.4* 38.0*  PLT 213 218   BMET Recent Labs    12/21/23 0615 12/22/23 0543  NA 136 139  K 3.1* 3.0*  CL 101 102  CO2 28 27  GLUCOSE 125* 153*  BUN 10 9  CREATININE 0.86 0.78  CALCIUM 8.7* 8.9   PT/INR No results for input(s): "LABPROT", "INR" in the last 72 hours. CMP     Component Value Date/Time   NA 139 12/22/2023 0543   K 3.0 (L) 12/22/2023 0543   CL 102 12/22/2023 0543   CO2 27 12/22/2023 0543   GLUCOSE 153 (H) 12/22/2023 0543   BUN 9 12/22/2023 0543   CREATININE 0.78 12/22/2023 0543   CALCIUM 8.9 12/22/2023 0543   PROT 7.9 12/15/2023 0807   ALBUMIN 4.1 12/15/2023 0807    AST 20 12/15/2023 0807   ALT 17 12/15/2023 0807   ALKPHOS 49 12/15/2023 0807   BILITOT 0.7 12/15/2023 0807   GFRNONAA >60 12/22/2023 0543   GFRAA >90 08/31/2014 0422   Lipase     Component Value Date/Time   LIPASE 27 12/15/2023 0807    Studies/Results: No results found.  Anti-infectives: Anti-infectives (From admission, onward)    Start     Dose/Rate Route Frequency Ordered Stop   12/19/23 1000  ciprofloxacin (CIPRO) IVPB 400 mg        400 mg 200 mL/hr over 60 Minutes Intravenous On call to O.R. 12/19/23 0903 12/19/23 1047   12/19/23 1000  metroNIDAZOLE (FLAGYL) IVPB 500 mg        500 mg 100 mL/hr over 60 Minutes Intravenous On call to O.R. 12/19/23 0903 12/19/23 1047   12/19/23 0945  cefoTEtan (CEFOTAN) 2 g in sodium chloride 0.9 % 100 mL IVPB  Status:  Discontinued        2 g 200 mL/hr over 30 Minutes Intravenous On call to O.R. 12/19/23 0936 12/19/23 1504   12/19/23 0922  sodium chloride 0.9 % with cefoTEtan (CEFOTAN) ADS Med       Note to Pharmacy: Patsi Sears E: cabinet override      12/19/23 0922 12/19/23 1048  12/19/23 0922  metroNIDAZOLE (FLAGYL) 500 MG/100ML IVPB       Note to Pharmacy: Geraldyne Kluver E: cabinet override      12/19/23 4782 12/19/23 1047   12/18/23 1400  neomycin (MYCIFRADIN) tablet 1,000 mg  Status:  Discontinued       Placed in "And" Linked Group   1,000 mg Oral 3 times per day 12/19/23 0936 12/19/23 1236   12/18/23 1400  metroNIDAZOLE (FLAGYL) tablet 1,000 mg  Status:  Discontinued       Placed in "And" Linked Group   1,000 mg Oral 3 times per day 12/19/23 9562 12/19/23 1236        Assessment/Plan POD 3 s/p laparoscopic converted to open sigmoid colectomy with 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 Alethea Andes on 12/19/23 - Path benign. Discussed w/ pt.  - Adv to soft diet.  - WOCN consult for new ostomy - Mobilize, PT - Pulm toilet  FEN - Soft, shakes, IVF per primary. Replace hypokalemia.  VTE - SCDs, Lovenox ID  - No further abx needed Foley - TOV today Plan - As above.    LOS: 7 days    Delton Filbert, [facility] 12/22/2023, 10:47 AM Please see Amion for pager number during day hours 7:00am-4:30pm

## 2023-12-22 NOTE — Plan of Care (Signed)

## 2023-12-22 NOTE — Plan of Care (Signed)

## 2023-12-22 NOTE — Assessment & Plan Note (Signed)
 Tobacco use - TOC consult

## 2023-12-22 NOTE — Consult Note (Signed)
 WOC Nurse ostomy consult note Stoma is red and viable, 1 1/4 inches, located in a valley.   No stool or flatus, small amt brown drainage.  Pt assisted and used a hand held mirror while pouch change was performed. He was able to stretch and apply barrier ring and one piece convex pouch. He was able to open and close velcro to empty.   Reviewed pouching routines and ordering supplies.  Educational materials left at the bedside and 5 sets of each supply for staff nurses' use.  Use Supplies: barrier ring Timm Foot # [id number] and convex Richardine Chancy # [phone] WOC team will perform another pouch change and teaching session on Mon if patient is still in the hospital at that time.   Enrolled patient in [employer] DC program: Yes, previously. Thank-you,  Wiliam Harder MSN, RN, CWOCN, [place], CNS [phone]

## 2023-12-22 NOTE — Assessment & Plan Note (Addendum)
 3.0 this AM. - PO potassium 60 mEq x1 today. - Add-on magnesium lab - AM BMPs

## 2023-12-22 NOTE — Progress Notes (Signed)
 Mobility Specialist Progress Note:    12/22/23 1100  Mobility  Activity Ambulated with assistance in hallway  Level of Assistance Standby assist, set-up cues, supervision of patient - no hands on  Assistive Device Front wheel walker  Distance Ambulated (ft) 550 ft  Activity Response Tolerated well  Mobility Referral Yes  Mobility visit 1 Mobility  Mobility Specialist Start Time (ACUTE ONLY) 1048  Mobility Specialist Stop Time (ACUTE ONLY) 1058  Mobility Specialist Time Calculation (min) (ACUTE ONLY) 10 min   Received pt in chair having no complaints and agreeable to mobility. Pt was asymptomatic throughout ambulation and returned to room w/o fault. Left in chair w/ call bell in reach and all needs met.   Manuel Morales Mobility Specialist Please contact via Special educational needs teacher or Rehab office at [phone]

## 2023-12-22 NOTE — Assessment & Plan Note (Addendum)
 Saturating well on room air. 90%-97% on RA.  - Place pulse ox - O2 goal 88% - 92%

## 2023-12-22 NOTE — Assessment & Plan Note (Addendum)
 Pathology negative. Passing flatus with PT yesterday. Can move around with walker. Will work with PT again today. Still spilling urine around catheter. Voiding trial today.  - Appreciate surgery recs - PT consult, very much appreciate their help with this patient.  - Vitals and pulse ox - Continue pain regimen per surgery including  -IV morphine 2 mg every 2 hours as needed for breakthrough pain  -Oxycodone 5 to 10 mg every 4 hours as needed for moderate pain and severe pain. - WOC consulted for new ostomy, appreciate their work.  - Advance diet as able, soft diet today.  - Continue PRN simethicone for flatulence/gas pain - AM CBC

## 2023-12-22 NOTE — Assessment & Plan Note (Signed)
 A1c 6.1%.  - Will need outpatient follow-up as above

## 2023-12-23 DIAGNOSIS — Z9049 Acquired absence of other specified parts of digestive tract: Secondary | ICD-10-CM | POA: Diagnosis not present

## 2023-12-23 DIAGNOSIS — K56699 Other intestinal obstruction unspecified as to partial versus complete obstruction: Secondary | ICD-10-CM | POA: Diagnosis not present

## 2023-12-23 DIAGNOSIS — K6389 Other specified diseases of intestine: Secondary | ICD-10-CM | POA: Diagnosis not present

## 2023-12-23 LAB — CBC
HCT: 39.1 % (ref 39.0–52.0)
Hemoglobin: 13.7 g/dL (ref 13.0–17.0)
MCH: 35.1 pg — ABNORMAL HIGH (ref 26.0–34.0)
MCHC: 35 g/dL (ref 30.0–36.0)
MCV: 100.3 fL — ABNORMAL HIGH (ref 80.0–100.0)
Platelets: 245 10*3/uL (ref 150–400)
RBC: 3.9 MIL/uL — ABNORMAL LOW (ref 4.22–5.81)
RDW: 13.8 % (ref 11.5–15.5)
WBC: 7 10*3/uL (ref 4.0–10.5)
nRBC: 0 % (ref 0.0–0.2)

## 2023-12-23 LAB — BASIC METABOLIC PANEL WITH GFR
Anion gap: 13 (ref 5–15)
BUN: 8 mg/dL (ref 8–23)
CO2: 25 mmol/L (ref 22–32)
Calcium: 8.7 mg/dL — ABNORMAL LOW (ref 8.9–10.3)
Chloride: 102 mmol/L (ref 98–111)
Creatinine, Ser: 0.8 mg/dL (ref 0.61–1.24)
GFR, Estimated: >60 mL/min (ref >60)
Glucose, Bld: 108 mg/dL — ABNORMAL HIGH (ref 70–99)
Potassium: 3.4 mmol/L — ABNORMAL LOW (ref 3.5–5.1)
Sodium: 140 mmol/L (ref 135–145)

## 2023-12-23 LAB — ALDOSTERONE + RENIN ACTIVITY W/ RATIO
ALDO / PRA Ratio: <2.8 (ref 0.0–30.0)
Aldosterone: <1 ng/dL (ref 0.0–30.0)
PRA LC/MS/MS: 0.351 ng/mL/h (ref 0.167–5.380)

## 2023-12-23 LAB — GLUCOSE, CAPILLARY: Glucose-Capillary: 125 mg/dL — ABNORMAL HIGH (ref 70–99)

## 2023-12-23 MED ORDER — POTASSIUM CHLORIDE CRYS ER 20 MEQ PO TBCR: 60.0000 meq | EXTENDED_RELEASE_TABLET | Freq: Once | ORAL | Status: DC

## 2023-12-23 MED ORDER — POTASSIUM CHLORIDE CRYS ER 20 MEQ PO TBCR
40.0000 meq | EXTENDED_RELEASE_TABLET | Freq: Once | ORAL | Status: AC
  Administered 2023-12-23: 40 meq via ORAL
  Filled 2023-12-23: qty 2

## 2023-12-23 NOTE — Assessment & Plan Note (Signed)
 Tobacco use - TOC consult

## 2023-12-23 NOTE — Plan of Care (Signed)
   Problem: Activity: Goal: Risk for activity intolerance will decrease Outcome: Progressing   Problem: Coping: Goal: Level of anxiety will decrease Outcome: Progressing   Problem: Safety: Goal: Ability to remain free from injury will improve Outcome: Progressing

## 2023-12-23 NOTE — Progress Notes (Signed)
 Occupational Therapy Treatment & Discharge Patient Details Name: Manuel Morales MRN: [id number] DOB: 12/16/1945 Today's Date: 12/23/2023   History of present illness Patient is 78 y.o. male s/p laparoscopic converted to open sigmoid colectomy with 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 Alethea Andes on 12/19/23. PMH significant for OA, HTN, diverticulosis.   OT comments  Pt progressed well to complete tub-shower transfer with RW, and was mod I. Pt showing good safety awareness and balance during functional transfers. Mobility with RW was mod I, pt wanting to mobilize with no AD, good balance, with S for safety. All acute OT needs met. No follow up OT or DME needs. OT is signing off on this pt.       If plan is discharge home, recommend the following:  A little help with walking and/or transfers;Help with stairs or ramp for entrance   Equipment Recommendations  None recommended by OT    Recommendations for Other Services      Precautions / Restrictions Precautions Precautions: Fall Recall of Precautions/Restrictions: Intact Restrictions Weight Bearing Restrictions Per Provider Order: No       Mobility Bed Mobility Overal bed mobility: Modified Independent             General bed mobility comments: HOB elevated    Transfers Overall transfer level: Modified independent Equipment used: Rolling walker (2 wheels)     General transfer comment: Mod I with use of RW, S for safety no AD     Balance Overall balance assessment: Needs assistance Sitting-balance support: Feet supported Sitting balance-Leahy Scale: Normal     Standing balance support: During functional activity, Bilateral upper extremity supported Standing balance-Leahy Scale: Good     ADL either performed or assessed with clinical judgement   ADL Overall ADL's : Modified independent   General ADL Comments: Shower transfer simulated today, pt mobilizing well near baseline     Extremity/Trunk Assessment Upper Extremity Assessment Upper Extremity Assessment: Overall WFL for tasks assessed   Lower Extremity Assessment Lower Extremity Assessment: Defer to PT evaluation        Vision   Vision Assessment?: No apparent visual deficits         Communication Communication Communication: No apparent difficulties   Cognition Arousal: Alert Behavior During Therapy: WFL for tasks assessed/performed Cognition: No apparent impairments       Following commands: Intact        Cueing   Cueing Techniques: Verbal cues        General Comments VSS on RA    Pertinent Vitals/ Pain       Pain Assessment Pain Assessment: No/denies pain Pain Intervention(s): Monitored during session, Repositioned   Frequency  Min 2X/week        Progress Toward Goals  OT Goals(current goals can now be found in the care plan section)  Progress towards OT goals: Goals met/education completed, patient discharged from OT  Acute Rehab OT Goals Patient Stated Goal: To go home OT Goal Formulation: With patient Time For Goal Achievement: 01/05/24 Potential to Achieve Goals: Good ADL Goals Pt Will Perform Lower Body Dressing: with modified independence;sit to/from stand Pt Will Transfer to Toilet: with modified independence;ambulating;regular height toilet Pt Will Perform Toileting - Clothing Manipulation and hygiene: with modified independence;sit to/from stand Pt Will Perform Tub/Shower Transfer: with modified independence;ambulating  Plan         AM-PAC OT "6 Clicks" Daily Activity     Outcome Measure   Help from another person eating meals?: None Help  from another person taking care of personal grooming?: None Help from another person toileting, which includes using toliet, bedpan, or urinal?: None Help from another person bathing (including washing, rinsing, drying)?: None Help from another person to put on and taking off regular upper body clothing?: None Help  from another person to put on and taking off regular lower body clothing?: None 6 Click Score: 24    End of Session Equipment Utilized During Treatment: Rolling walker (2 wheels)  OT Visit Diagnosis: Other abnormalities of gait and mobility (R26.89);Muscle weakness (generalized) (M62.81)   Activity Tolerance Patient tolerated treatment well   Patient Left in chair;with call bell/phone within reach   Nurse Communication Mobility status        Time: 6644-0347 OT Time Calculation (min): 9 min  Charges: OT General Charges $OT Visit: 1 Visit OT Treatments $Self Care/Home Management : 8-22 mins  Delmer Ferraris, OT  Acute Rehabilitation Services Office [phone] Secure chat preferred   Mickael Alamo 12/23/2023, 7:49 AM

## 2023-12-23 NOTE — Progress Notes (Signed)
 Mobility Specialist Progress Note:    12/23/23 1000  Mobility  Activity Ambulated independently in hallway  Level of Assistance Standby assist, set-up cues, supervision of patient - no hands on  Assistive Device None  Distance Ambulated (ft) 400 ft  Activity Response Tolerated well  Mobility Referral Yes  Mobility visit 1 Mobility  Mobility Specialist Start Time (ACUTE ONLY) 1009  Mobility Specialist Stop Time (ACUTE ONLY) 1017  Mobility Specialist Time Calculation (min) (ACUTE ONLY) 8 min   Received pt in chair having no complaints and agreeable to mobility. Pt was asymptomatic throughout ambulation and returned to room w/o fault. Left in chair w/ call bell in reach and all needs met.   D'Vante Nolon Baxter Mobility Specialist Please contact via Special educational needs teacher or Rehab office at [phone]

## 2023-12-23 NOTE — Discharge Instructions (Addendum)
 Dear Manuel Morales,  Thank you for letting us  participate in your care. You were hospitalized for constipation and diagnosed with colonic stricture. You were treated with surgical removal of colon and placement of ostomy.   POST-HOSPITAL & CARE INSTRUCTIONS Please work with PT to improve your mobility status.  Go to your follow up appointments (listed below)   DOCTOR'S APPOINTMENT   No future appointments.   Take care and be well!  Family Medicine Teaching Service Inpatient Team [place]  [facility]  [address] [place], [place] [address] [phone]

## 2023-12-23 NOTE — Assessment & Plan Note (Signed)
 3.4 this AM.  Magnesium 1.8 4/17, WNL. - PO potassium 40 mEq x1 today. - AM BMPs

## 2023-12-23 NOTE — Assessment & Plan Note (Signed)
 Saturating into the low 90s on room air. - Continue pulse ox - O2 goal 88% - 92%

## 2023-12-23 NOTE — Assessment & Plan Note (Addendum)
 Voiding trial successful. Passing flatus in room. Feels well today. Eating soft foods. Diet advancement per surgery.   - Appreciate surgery recs - PT consult, very much appreciate their help with this patient.  - Vitals and pulse ox - Continue pain regimen per surgery including  -IV morphine  2 mg every 2 hours as needed for breakthrough pain  -Oxycodone  5 to 10 mg every 4 hours as needed for moderate pain and severe pain. - WOC consulted for new ostomy, appreciate their work.  - Advance diet as able, - Continue PRN simethicone  for flatulence/gas pain - AM CBC

## 2023-12-23 NOTE — Assessment & Plan Note (Signed)
 A1c 6.1%.  - Will need outpatient follow-up as above

## 2023-12-23 NOTE — Assessment & Plan Note (Addendum)
 186/94.  Asymptomatic.  - Continue amlodipine  10 mg daily  - Awaiting aldosterin/renin ratio for primary HTN workup. - TOC consult placed for PCP follow-up

## 2023-12-23 NOTE — Progress Notes (Signed)
    Assessment & Plan: POD#4 - s/p laparoscopic converted to open sigmoid colectomy with 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 Curvin on 12/19/23 - Path benign - soft diet - WOCN consult following - Mobilize, PT - Pulm toilet   FEN - Soft, shakes, IVF per primary VTE - SCDs, Lovenox  ID - No further abx needed Foley - none  Doing well post op and largely managing colostomy care himself.  Encouraged OOB and ambulation.  Likely ready for discharge home next 1-2 days per medical service.        Krystal Spinner, MD [facility] practice Office: [phone]        Chief Complaint: Colonic stricture  Subjective: Patient up in chair, in good spirits, waiting for lunch.  No complaints.  Objective: Vital signs in last 24 hours: Temp:  [97.8 F (36.6 C)-98.6 F (37 C)] 97.9 F (36.6 C) (04/18 0721) Pulse Rate:  [84-91] 91 (04/18 0721) Resp:  [15-18] 17 (04/18 0721) BP: (128-186)/(80-94) 186/94 (04/18 0721) SpO2:  [91 %-94 %] 94 % (04/18 0721) Last BM Date : 12/23/23  Intake/Output from previous day: 04/17 0701 - 04/18 0700 In: 480 [P.O.:480] Out: -  Intake/Output this shift: Total I/O In: 240 [P.O.:240] Out: -   Physical Exam: HEENT - sclerae clear, mucous membranes moist Abdomen - soft, protuberant; stoma viable, small stool in bag; midline wound dry and intact  Lab Results:  Recent Labs    12/22/23 0543 12/23/23 0349  WBC 7.5 7.0  HGB 13.4 13.7  HCT 38.0* 39.1  PLT 218 245   BMET Recent Labs    12/22/23 0543 12/23/23 0349  NA 139 140  K 3.0* 3.4*  CL 102 102  CO2 27 25  GLUCOSE 153* 108*  BUN 9 8  CREATININE 0.78 0.80  CALCIUM 8.9 8.7*   PT/INR No results for input(s): LABPROT, INR in the last 72 hours. Comprehensive Metabolic Panel:    Component Value Date/Time   NA 140 12/23/2023 0349   NA 139 12/22/2023 0543   K 3.4 (L) 12/23/2023 0349   K 3.0 (L) 12/22/2023 0543   CL 102 12/23/2023 0349   CL  102 12/22/2023 0543   CO2 25 12/23/2023 0349   CO2 27 12/22/2023 0543   BUN 8 12/23/2023 0349   BUN 9 12/22/2023 0543   CREATININE 0.80 12/23/2023 0349   CREATININE 0.78 12/22/2023 0543   GLUCOSE 108 (H) 12/23/2023 0349   GLUCOSE 153 (H) 12/22/2023 0543   CALCIUM 8.7 (L) 12/23/2023 0349   CALCIUM 8.9 12/22/2023 0543   AST 20 12/15/2023 0807   AST 21 08/27/2014 1024   ALT 17 12/15/2023 0807   ALT 14 08/27/2014 1024   ALKPHOS 49 12/15/2023 0807   ALKPHOS 58 08/27/2014 1024   BILITOT 0.7 12/15/2023 0807   BILITOT 1.0 08/27/2014 1024   PROT 7.9 12/15/2023 0807   PROT 7.1 08/27/2014 1024   ALBUMIN 4.1 12/15/2023 0807   ALBUMIN 3.7 08/27/2014 1024    Studies/Results: No results found.    Krystal Spinner 12/23/2023  Patient ID: Manuel Morales, male   DOB: [birth year], 78 y.o.   MRN: [id number]

## 2023-12-23 NOTE — Progress Notes (Signed)
 Daily Progress Note Intern Pager: [phone]  Patient name: Manuel Morales Medical record number: [id number] Date of birth: December 31, 1945 Age: 78 y.o. Gender: male  Primary Care Provider: Charle Congo, MD Consultants: General Surgery Code Status: Full  Pt Overview and Major Events to Date:   Rc Amison is a 78 year old male s/p colonic resection for sigmoid stricture.  POD4.  Patient well-appearing.  Continues to spike high blood pressures, asymptomatic throughout stay and likely related to pain.  We will need close PCP follow-up for antihypertensive regimen initiation - TOC consult ordered, appreciate their help. Believe patient is approaching discharge with home PT from a medical perspective if he can tolerate solid diets. Assessment & Plan Sigmoid stricture (HCC) Voiding trial successful. Passing flatus in room. Feels well today. Eating soft foods. Diet advancement per surgery.   - Appreciate surgery recs - PT consult, very much appreciate their help with this patient.  - Vitals and pulse ox - Continue pain regimen per surgery including  -IV morphine  2 mg every 2 hours as needed for breakthrough pain  -Oxycodone  5 to 10 mg every 4 hours as needed for moderate pain and severe pain. - WOC consulted for new ostomy, appreciate their work.  - Advance diet as able, - Continue PRN simethicone  for flatulence/gas pain - AM CBC HTN (hypertension) 186/94.  Asymptomatic.  - Continue amlodipine  10 mg daily  - Awaiting aldosterin/renin ratio for primary HTN workup. - TOC consult placed for PCP follow-up Oxygen desaturation Saturating into the low 90s on room air. - Continue pulse ox - O2 goal 88% - 92% Hypokalemia 3.4 this AM.  Magnesium 1.8 4/17, WNL. - PO potassium 40 mEq x1 today. - AM BMPs Prediabetes A1c 6.1%.  - Will need outpatient follow-up as above Chronic health problem Tobacco use - TOC consult  FEN/GI: Soft PPx: Lovenox  Dispo: Home with home PT, barriers  include advancing diet to regular  Subjective:   On interview, patient sitting up in chair.  Successfully completed voiding trial yesterday.  He has been walking with physical therapy.  Denied headache and vision changes.   Objective:  BP: 186/94 HR: 91 RR: 17 T: 97.9 O2sat: 94% on room air  Significant vitals over past 24 hours:   Physical Exam:  General: Well-appearing male, NAD, attentive. Cardiovascular: RRR no m/r/g. Respiratory: CTAB. No w/r/r.  Abdomen: No tenderness in 4 quadrants. BS+.  Ostomy and surgical wound appear within normal limits.  No erythema or drainage.  Ostomy bag filling with brown liquids, appropriately.  Passing flatus in room. Extremities: ROM intact.  Nonedematous.  Basic labs:  Most recent CBC Lab Results  Component Value Date   WBC 7.0 12/23/2023   HGB 13.7 12/23/2023   HCT 39.1 12/23/2023   MCV 100.3 (H) 12/23/2023   PLT 245 12/23/2023   Most recent BMP    Latest Ref Rng & Units 12/23/2023    3:49 AM  BMP  Glucose 70 - 99 mg/dL 244   BUN 8 - 23 mg/dL 8   Creatinine 0.10 - 2.72 mg/dL 5.36   Sodium 644 - 034 mmol/L 140   Potassium 3.5 - 5.1 mmol/L 3.4   Chloride 98 - 111 mmol/L 102   CO2 22 - 32 mmol/L 25   Calcium 8.9 - 10.3 mg/dL 8.7     Other pertinent labs:  K: 3.4  Imaging/Diagnostic Tests:  No new imaging.  Gwyndolyn Lerner, MD 12/23/2023, 7:19 AM  PGY-1, [facility] Family Medicine FPTS Intern pager: [phone], text pages welcome  Secure chat group [facility] Teaching Service

## 2023-12-24 ENCOUNTER — Other Ambulatory Visit (HOSPITAL_COMMUNITY): Payer: Self-pay

## 2023-12-24 DIAGNOSIS — K6389 Other specified diseases of intestine: Secondary | ICD-10-CM | POA: Diagnosis not present

## 2023-12-24 DIAGNOSIS — K56699 Other intestinal obstruction unspecified as to partial versus complete obstruction: Secondary | ICD-10-CM | POA: Diagnosis not present

## 2023-12-24 DIAGNOSIS — Z9049 Acquired absence of other specified parts of digestive tract: Secondary | ICD-10-CM | POA: Diagnosis not present

## 2023-12-24 LAB — BASIC METABOLIC PANEL WITH GFR
Anion gap: 14 (ref 5–15)
BUN: 11 mg/dL (ref 8–23)
CO2: 23 mmol/L (ref 22–32)
Calcium: 9 mg/dL (ref 8.9–10.3)
Chloride: 101 mmol/L (ref 98–111)
Creatinine, Ser: 0.86 mg/dL (ref 0.61–1.24)
GFR, Estimated: >60 mL/min (ref >60)
Glucose, Bld: 151 mg/dL — ABNORMAL HIGH (ref 70–99)
Potassium: 3.4 mmol/L — ABNORMAL LOW (ref 3.5–5.1)
Sodium: 138 mmol/L (ref 135–145)

## 2023-12-24 MED ORDER — OXYCODONE HCL 5 MG PO TABS
5.0000 mg | ORAL_TABLET | ORAL | 0 refills | Status: DC | PRN
  Filled 2023-12-24: qty 20, 4d supply, fill #0

## 2023-12-24 MED ORDER — SIMETHICONE 80 MG PO CHEW: 80.0000 mg | CHEWABLE_TABLET | Freq: Four times a day (QID) | ORAL | Status: DC | PRN

## 2023-12-24 MED ORDER — METHOCARBAMOL 500 MG PO TABS
500.0000 mg | ORAL_TABLET | Freq: Four times a day (QID) | ORAL | 0 refills | Status: DC | PRN
  Filled 2023-12-24: qty 15, 4d supply, fill #0

## 2023-12-24 MED ORDER — ACETAMINOPHEN 500 MG PO TABS
1000.0000 mg | ORAL_TABLET | Freq: Four times a day (QID) | ORAL | Status: DC | PRN
Start: 2023-12-24 — End: 2024-07-24

## 2023-12-24 MED ORDER — POTASSIUM CHLORIDE CRYS ER 20 MEQ PO TBCR
40.0000 meq | EXTENDED_RELEASE_TABLET | Freq: Once | ORAL | Status: AC
  Administered 2023-12-24: 40 meq via ORAL
  Filled 2023-12-24: qty 2

## 2023-12-24 MED ORDER — POLYETHYLENE GLYCOL 3350 17 G PO PACK: 17.0000 g | PACK | Freq: Two times a day (BID) | ORAL | Status: DC

## 2023-12-24 NOTE — Assessment & Plan Note (Signed)
 Patient mildly hypertensive over last 24 hours.  Aldosterone/renin ratio negative. - Continue amlodipine  10 mg daily  - Will need follow-up outpatient with PCP

## 2023-12-24 NOTE — Assessment & Plan Note (Signed)
 A1c 6.1%.  - Will need outpatient follow-up as above

## 2023-12-24 NOTE — Assessment & Plan Note (Signed)
 Status post repletion.  In setting of decreased intake with recent surgery. -Replete as necessary - AM BMPs

## 2023-12-24 NOTE — Plan of Care (Signed)
   Problem: Activity: Goal: Risk for activity intolerance will decrease Outcome: Progressing   Problem: Nutrition: Goal: Adequate nutrition will be maintained Outcome: Progressing   Problem: Elimination: Goal: Will not experience complications related to bowel motility Outcome: Progressing

## 2023-12-24 NOTE — Assessment & Plan Note (Signed)
 Saturating into the low 90s on room air. - Monitor clinically and with pulse ox

## 2023-12-24 NOTE — Progress Notes (Signed)
    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 Alethea Andes on 12/19/23 - Path benign - soft diet - WOCN consult following - Mobilize, PT - Pulm toilet   FEN - Soft, shakes, IVF per primary VTE - SCDs, Lovenox  ID - No further abx needed Foley - none   Doing well post op and largely managing colostomy care himself.  Appears to be ready for discharge home today - per medical service.  Will arrange follow up next week at [facility] office for wound check and staple removal.        Manuel Billow, MD [facility] practice Office: [phone]        Chief Complaint: Colonic stricture  Subjective: Patient up in chair, caring for ostomy independently.  Tolerating diet.  Objective: Vital signs in last 24 hours: Temp:  [97.8 F (36.6 C)-98.9 F (37.2 C)] 98.4 F (36.9 C) (04/19 0742) Pulse Rate:  [71-87] 75 (04/19 0742) Resp:  [15-18] 15 (04/19 0742) BP: (125-146)/(77-83) 144/77 (04/19 0742) SpO2:  [95 %-97 %] 96 % (04/19 0742) Last BM Date : 12/23/23  Intake/Output from previous day: 04/18 0701 - 04/19 0700 In: 720 [P.O.:720] Out: -  Intake/Output this shift: No intake/output data recorded.  Physical Exam: HEENT - sclerae clear, mucous membranes moist Abdomen - soft, obese; stoma viable with stool in bag; midline wound dry and intact with staples  Lab Results:  Recent Labs    12/22/23 0543 12/23/23 0349  WBC 7.5 7.0  HGB 13.4 13.7  HCT 38.0* 39.1  PLT 218 245   BMET Recent Labs    12/22/23 0543 12/23/23 0349  NA 139 140  K 3.0* 3.4*  CL 102 102  CO2 27 25  GLUCOSE 153* 108*  BUN 9 8  CREATININE 0.78 0.80  CALCIUM 8.9 8.7*   PT/INR No results for input(s): "LABPROT", "INR" in the last 72 hours. Comprehensive Metabolic Panel:    Component Value Date/Time   NA 140 12/23/2023 0349   NA 139 12/22/2023 0543   K 3.4 (L) 12/23/2023 0349   K  3.0 (L) 12/22/2023 0543   CL 102 12/23/2023 0349   CL 102 12/22/2023 0543   CO2 25 12/23/2023 0349   CO2 27 12/22/2023 0543   BUN 8 12/23/2023 0349   BUN 9 12/22/2023 0543   CREATININE 0.80 12/23/2023 0349   CREATININE 0.78 12/22/2023 0543   GLUCOSE 108 (H) 12/23/2023 0349   GLUCOSE 153 (H) 12/22/2023 0543   CALCIUM 8.7 (L) 12/23/2023 0349   CALCIUM 8.9 12/22/2023 0543   AST 20 12/15/2023 0807   AST 21 08/27/2014 1024   ALT 17 12/15/2023 0807   ALT 14 08/27/2014 1024   ALKPHOS 49 12/15/2023 0807   ALKPHOS 58 08/27/2014 1024   BILITOT 0.7 12/15/2023 0807   BILITOT 1.0 08/27/2014 1024   PROT 7.9 12/15/2023 0807   PROT 7.1 08/27/2014 1024   ALBUMIN 4.1 12/15/2023 0807   ALBUMIN 3.7 08/27/2014 1024    Studies/Results: No results found.    Manuel Morales 12/24/2023  Patient ID: Manuel Morales, male   DOB: 03-Jun-1946, 78 y.o.   MRN: [id number]

## 2023-12-24 NOTE — Assessment & Plan Note (Addendum)
 Postop day 4 from resection.  Continues to improve and passed flatus.  He is taking good p.o.  PT/OT signed off recommend home health. -General Surgery following, appreciate recommendations - Continue Tylenol  and oxycodone  for pain -May be able to DC today

## 2023-12-24 NOTE — Progress Notes (Addendum)
 RN reinforced education of colostomy care. Patient was able to verbalize the process of changed colostomy bag. Patient performed emptying of bag and demonstrated understanding of colostomy care.

## 2023-12-24 NOTE — TOC Transition Note (Addendum)
 Transition of Care [facility]) - Discharge Note   Patient Details  Name: Manuel Morales MRN: [id number] Date of Birth: 09-Jul-1946  Transition of Care [facility]) CM/SW Contact:  Jannine Meo, RN Phone Number: 12/24/2023, 12:36 PM   Clinical Narrative:   Patient is being discharged today. HH PT and RN orders noted. Message left with [facility] [phone] and information faxed to [phone].  1337: No response from [facility]. Per floor nurse patient has been trained on ostomy care and demonstrates ability to perform care needed. Provider is ok with patient follow up outpatient with PCP if [facility] unable to be arranged.    Final next level of care: Home w Home Health Services Barriers to Discharge: No Barriers Identified   Patient Goals and CMS Choice Patient states their goals for this hospitalization and ongoing recovery are:: to go home          Discharge Placement                       Discharge Plan and Services Additional resources added to the After Visit Summary for     Discharge Planning Services: CM Consult                      HH Arranged: PT, RN [facility] Agency: [facility] Date HH Agency Contacted: 12/24/23 Time HH Agency Contacted: 1230 Representative spoke with at [facility] Agency: Message left @ phone number [phone]. Patient information faxed to [phone]  Social Drivers of Health (SDOH) Interventions SDOH Screenings   Food Insecurity: No Food Insecurity (12/15/2023)  Housing: Low Risk  (12/15/2023)  Transportation Needs: No Transportation Needs (12/15/2023)  Utilities: Not At Risk (12/15/2023)  Social Connections: Moderately Isolated (12/15/2023)  Tobacco Use: High Risk (12/19/2023)     Readmission Risk Interventions     No data to display

## 2023-12-24 NOTE — Progress Notes (Signed)
     Daily Progress Note Intern Pager: [phone]  Patient name: Manuel Morales Medical record number: [id number] Date of birth: 1946/08/30 Age: 78 y.o. Gender: male  Primary Care Provider: Charle Congo, MD Consultants: General Surgery Code Status: Full  Pt Overview and Major Events to Date:  4/10-admitted 4/14-sigmoid colectomy  Assessment and Plan:  Patient is a 78 year old male presenting for sigmoid stricture now status post colonic resection. Pertinent PMH/PSH includes hypertension, prediabetes, tobacco use..  Assessment & Plan Sigmoid stricture (HCC) Postop day 4 from resection.  Continues to improve and passed flatus.  He is taking good p.o.  PT/OT signed off recommend home health. -General Surgery following, appreciate recommendations - Continue Tylenol  and oxycodone  for pain -May be able to DC today HTN (hypertension) Patient mildly hypertensive over last 24 hours.  Aldosterone/renin ratio negative. - Continue amlodipine  10 mg daily  - Will need follow-up outpatient with PCP Oxygen desaturation Saturating into the low 90s on room air. - Monitor clinically and with pulse ox Hypokalemia Status post repletion.  In setting of decreased intake with recent surgery. -Replete as necessary - AM BMPs Prediabetes A1c 6.1%.  - Will need outpatient follow-up as above Chronic health problem Tobacco use -will need outpatient follow-up for cessation  FEN/GI: Soft diet PPx: Lovenox  Dispo:Home with home health  Subjective:  Doing well this morning.  He would like to go home.  He would just like some further education with his colostomy from RN before leaving.  Objective: Temp:  [97.8 F (36.6 C)-98.9 F (37.2 C)] 98.4 F (36.9 C) (04/19 0742) Pulse Rate:  [71-87] 75 (04/19 0742) Resp:  [15-18] 15 (04/19 0742) BP: (125-146)/(77-83) 144/77 (04/19 0742) SpO2:  [95 %-97 %] 96 % (04/19 0742) Physical Exam: General: Sitting up in chair, no acute distress,  conversant Cardiovascular: Regular rate and rhythm without murmurs rubs or gallops Respiratory: Clear to auscultation bilaterally Abdomen: Soft, mildly distended (though unclear if baseline), nontender, normoactive bowel sounds, ostomy bag in place Extremities: Moves all grossly equally  Laboratory: Most recent CBC Lab Results  Component Value Date   WBC 7.0 12/23/2023   HGB 13.7 12/23/2023   HCT 39.1 12/23/2023   MCV 100.3 (H) 12/23/2023   PLT 245 12/23/2023   Most recent BMP    Latest Ref Rng & Units 12/23/2023    3:49 AM  BMP  Glucose 70 - 99 mg/dL 147   BUN 8 - 23 mg/dL 8   Creatinine 8.29 - 5.62 mg/dL 1.30   Sodium 865 - 784 mmol/L 140   Potassium 3.5 - 5.1 mmol/L 3.4   Chloride 98 - 111 mmol/L 102   CO2 22 - 32 mmol/L 25   Calcium 8.9 - 10.3 mg/dL 8.7    Dema Filler, MD 12/24/2023, 8:17 AM  PGY-2, [facility] FPTS Intern pager: [phone], text pages welcome Secure chat group [facility] Teaching Service

## 2023-12-24 NOTE — Assessment & Plan Note (Signed)
 Tobacco use -will need outpatient follow-up for cessation

## 2023-12-24 NOTE — Discharge Summary (Addendum)
 Family Medicine Teaching [facility] Discharge Summary  Patient name: Manuel Morales Medical record number: [id number] Date of birth: 25-Nov-1945 Age: 78 y.o. Gender: male Date of Admission: 12/15/2023  Date of Discharge: 12/24/2023 Admitting Physician: Arn Lane, MD  Primary Care Provider: Charle Congo, MD Consultants: General Surgery  Indication for Hospitalization: Sigmoid stricture  Discharge Diagnoses/Problem List:  Principal Problem for Admission: Sigmoid stricture Other Problems addressed during stay:  Principal Problem:   Colonic mass Active Problems:   Hypokalemia   HTN (hypertension)   Chronic health problem   Prediabetes   Sigmoid stricture (HCC)   Diverticulosis of colon with hemorrhage   Left lower quadrant abdominal pain   Oxygen desaturation   S/P colectomy  Brief Hospital Course:  Manuel Morales is a 78 y.o.male with a past medical history of arthritis,hypertension and remote diverticulitis who was admitted to the family medicine teaching Service at [facility] for 3 day history of constipation. His hospital course is detailed below:  Constipation (colonic stricture)   CT was obtained and demonstrated sigmoid diverticulosis, segmental thickening of the sigmoid colon with luminal narrowing concerning for stricture.  Biopsy was returned negative for malignancy.  GI was consulted recommended Miralax  twice daily and an enema for flexible sigmoidoscopy for further assessment. Procedure demonstrated likely stricture.  Biopsies sent.  Underwent chronic resection 4/14 without complications.  Surgical wound and new ostomy remained clean/dry with no erythema or drainage.  Afebrile.  By postop day 3, patient was ambulating well, passing flatus, and Foley removed.  Discharge condition was stable.   Hypokalemia  Was hypokalemic to 3.1 on admission likely in setting of decreased p.o. intake.  Had persistent hypokalemia throughout admission, requiring multiple  potassium infusions.  Magnesium WNL.  His electrolytes were monitored and repleted as needed throughout admission.  Hypertension Patient remained hypertensive throughout stay.  Titrated patient to amlodipine  10 mg, a medication he had previously been on but had not taken in a number of years.  Likely complicated by postsurgical pain/discomfort.  Patient denied headache and vision changes throughout stay.  Other chronic conditions were medically managed with home medications and formulary alternatives as necessary (HTN)  PCP Follow-up Recommendations: Repeat BMP and mag Abdominal surgical site, ostomy evaluation Ensure surgical follow-up Assess BP control Ensure patient is set up with home health s/p surgery and reassess need   Disposition: Home  Discharge Condition: Stable  Discharge Exam:  Vitals:   12/24/23 0437 12/24/23 0742  BP: 125/82 (!) 144/77  Pulse: 79 75  Resp: 18 15  Temp: 98.2 F (36.8 C) 98.4 F (36.9 C)  SpO2: 96% 96%   General: Sitting up in chair, no acute distress, conversant Cardiovascular: Regular rate and rhythm without murmurs rubs or gallops Respiratory: Clear to auscultation bilaterally Abdomen: Soft, mildly distended (though unclear if baseline), nontender, normoactive bowel sounds, ostomy bag in place Extremities: Moves all grossly equally  Significant Procedures: Sigmoid colectomy  Significant Labs and Imaging:  Recent Labs  Lab 12/23/23 0349  WBC 7.0  HGB 13.7  HCT 39.1  PLT 245   Recent Labs  Lab 12/23/23 0349  NA 140  K 3.4*  CL 102  CO2 25  GLUCOSE 108*  BUN 8  CREATININE 0.80  CALCIUM 8.7*   CTAP IMPRESSION: 1. Sigmoid diverticulosis. Segmental thickening of the sigmoid colon with luminal narrowing may be related to muscular hypertrophy but concerning for underlying colonic mass. Further evaluation with colonoscopy is recommended. 2. Diarrheal state with dilatation of the colon proximal to the sigmoid.  No evidence of  small-bowel obstruction. Normal appendix. 3. Fatty liver. 4. Cholelithiasis.  Results/Tests Pending at Time of Discharge: None  Discharge Medications:  Allergies as of 12/24/2023       Reactions   Penicillins Hives        Medication List     TAKE these medications    acetaminophen  500 MG tablet Commonly known as: TYLENOL  Take 2 tablets (1,000 mg total) by mouth every 6 (six) hours as needed.   amLODipine  10 MG tablet Commonly known as: NORVASC  Take 10 mg by mouth daily.   methocarbamol  500 MG tablet Commonly known as: ROBAXIN  Take 1 tablet (500 mg total) by mouth every 6 (six) hours as needed for muscle spasms.   oxyCODONE  5 MG immediate release tablet Commonly known as: Oxy IR/ROXICODONE  Take 1 tablet (5 mg total) by mouth every 4 (four) hours as needed for moderate pain (pain score 4-6) or severe pain (pain score 7-10).   polyethylene glycol 17 g packet Commonly known as: MIRALAX  / GLYCOLAX  Take 17 g by mouth 2 (two) times daily.   simethicone  80 MG chewable tablet Commonly known as: MYLICON Chew 1 tablet (80 mg total) by mouth every 6 (six) hours as needed for flatulence (gas pain).   Xiidra 5 % Soln Generic drug: Lifitegrast Place 1 drop into both eyes 2 (two) times daily.        Discharge Instructions: Please refer to Patient Instructions section of EMR for full details.  Patient was counseled important signs and symptoms that should prompt return to medical care, changes in medications, dietary instructions, activity restrictions, and follow up appointments.   Follow-Up Appointments:  Follow-up Information     Charle Congo, MD. Schedule an appointment as soon as possible for a visit.   Specialty: Internal Medicine Why: for hospital follow-up ASAP. Contact information: [address] [place] [address] [phone]                 Dema Filler, MD 12/24/2023, 9:42 AM PGY-2, [facility]

## 2023-12-27 ENCOUNTER — Other Ambulatory Visit (HOSPITAL_COMMUNITY): Payer: Self-pay

## 2024-04-22 ENCOUNTER — Ambulatory Visit
Admission: RE | Admit: 2024-04-22 | Discharge: 2024-04-22 | Disposition: A | Discharge status: Home / Self Care | Source: Ambulatory Visit | Attending: Internal Medicine | Admitting: Internal Medicine

## 2024-04-22 VITALS — BP 149/78 | HR 76 | Temp 97.5°F | Resp 18 | Ht 69.0 in | Wt 240.1 lb

## 2024-04-22 DIAGNOSIS — R220 Localized swelling, mass and lump, head: Secondary | ICD-10-CM

## 2024-04-22 NOTE — ED Provider Notes (Signed)
 [facility]    CSN: [id number] Arrival date & time: 04/22/24  1036      History   Chief Complaint Chief Complaint  Patient presents with   Facial Pain    HPI Manuel Morales is a 78 y.o. male.   Discussed the use of AI scribe software for clinical note transcription with the patient, who gave verbal consent to proceed.   The patient presents with a chief complaint of swelling under the right earlobe that started on Thursday night. The swelling was described as real big and located up under my earlobe on just that side. The patient reports that the swelling began on Thursday night, persisted through Friday, and has since resolved. The patient expresses uncertainty about the cause, wondering if it could be a reaction to his blood pressure medication (amlodipine ), which he has been taking for a long time. The patient denies any recent illness, new medications, or other associated symptoms such as pain, difficulty swallowing, sore throat, shortness of breath, or fever. He also denies any swelling in his feet or ankles. The patient has not experienced any similar episodes in the past and reports that the swelling resolved without any intervention.  The following portions of the patient's history were reviewed and updated as appropriate: allergies, current medications, past family history, past medical history, past social history, past surgical history, and problem list.      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   Diverticulosis of colon with hemorrhage 12/16/2023   Colonic mass 12/15/2023   Hypokalemia 12/15/2023   HTN (hypertension) 12/15/2023   Chronic health problem 12/15/2023    Diverticulitis of large intestine without perforation or abscess without bleeding    Diverticulitis 08/27/2014   SBO (small bowel obstruction) (HCC) 08/27/2014   Tobacco use disorder 08/27/2014    Past Surgical History:  Procedure Laterality Date   BIOPSY OF SKIN SUBCUTANEOUS TISSUE AND/OR MUCOUS MEMBRANE  12/16/2023   Procedure: BIOPSY, SKIN, SUBCUTANEOUS TISSUE, OR MUCOUS MEMBRANE;  Surgeon: Legrand Victory LITTIE DOUGLAS, MD;  Location: [facility];  Service: Gastroenterology;;   COLONOSCOPY  ?1999   COLOSTOMY  12/19/2023   Procedure: CREATION, COLOSTOMY;  Surgeon: Curvin Deward DOUGLAS, MD;  Location: [facility] OR;  Service: General;;   FLEXIBLE SIGMOIDOSCOPY N/A 12/16/2023   Procedure: KINGSTON SIDE;  Surgeon: Legrand Victory LITTIE DOUGLAS, MD;  Location: [facility];  Service: Gastroenterology;  Laterality: N/A;   LAPAROSCOPIC SIGMOID COLECTOMY N/A 12/19/2023   Procedure: COLECTOMY, SIGMOID, LAPAROSCOPIC;  Surgeon: Curvin Deward DOUGLAS, MD;  Location: [facility] OR;  Service: General;  Laterality: N/A;  POSSIBLE COLOSTOMY       Home Medications    Prior to Admission medications   Medication Sig Start Date End Date Taking? Authorizing Provider  amLODipine  (NORVASC ) 10 MG tablet Take 10 mg by mouth daily. 03/17/21  Yes [provider]  acetaminophen  (TYLENOL ) 500 MG tablet Take 2 tablets (1,000 mg total) by mouth every 6 (six) hours as needed. 12/24/23   Tharon Lung, MD  methocarbamol  (ROBAXIN ) 500 MG tablet Take 1 tablet (500 mg total) by mouth every 6 (six) hours as needed for muscle spasms. 12/24/23   Tharon Lung, MD  oxyCODONE  (OXY IR/ROXICODONE ) 5 MG immediate  release tablet Take 1 tablet (5 mg total) by mouth every 4 (four) hours as needed for moderate pain (pain score 4-6) or severe pain (pain score 7-10). 12/24/23   Tharon Lung, MD  polyethylene glycol (MIRALAX  / GLYCOLAX ) 17 g packet Take 17 g by mouth 2 (two) times daily. 12/24/23   Tharon Lung, MD  simethicone  (MYLICON) 80 MG chewable tablet Chew 1 tablet  (80 mg total) by mouth every 6 (six) hours as needed for flatulence (gas pain). 12/24/23   Tharon Lung, MD  XIIDRA 5 % SOLN Place 1 drop into both eyes 2 (two) times daily. 09/27/23   [provider]    Family History Family History  Problem Relation Age of Onset   Dementia Mother    Colon polyps Brother    Colon cancer Neg Hx    Esophageal cancer Neg Hx    Rectal cancer Neg Hx    Stomach cancer Neg Hx     Social History Social History   Tobacco Use   Smoking status: Every Day    Current packs/day: 0.50    Average packs/day: 0.5 packs/day for 30.0 years (15.0 ttl pk-yrs)    Types: Cigarettes   Smokeless tobacco: Never  Vaping Use   Vaping status: Never Used  Substance Use Topics   Alcohol use: No   Drug use: No     Allergies   Penicillins   Review of Systems Review of Systems  Constitutional:  Negative for fever.  HENT:  Negative for congestion, ear pain, facial swelling, sore throat and trouble swallowing.        Swelling under earlobe   Respiratory:  Negative for shortness of breath.   Cardiovascular:  Negative for leg swelling.  Gastrointestinal:  Negative for nausea and vomiting.  Skin:  Negative for rash and wound.  Neurological:  Negative for dizziness and headaches.  All other systems reviewed and are negative.    Physical Exam Triage Vital Signs ED Triage Vitals  Encounter Vitals Group     BP 04/22/24 1045 (!) 149/78     Girls Systolic BP Percentile --      Girls Diastolic BP Percentile --      Boys Systolic BP Percentile --      Boys Diastolic BP Percentile --      Pulse Rate 04/22/24 1045 76     Resp 04/22/24 1045 18     Temp 04/22/24 1045 (!) 97.5 F (36.4 C)     Temp Source 04/22/24 1045 Oral     SpO2 04/22/24 1045 95 %     Weight 04/22/24 1044 240 lb 1.3 oz (108.9 kg)     Height 04/22/24 1044 5' 9 (1.753 m)     Head Circumference --      Peak Flow --      Pain Score 04/22/24 1044 0     Pain Loc --      Pain Education --       Exclude from Growth Chart --    No data found.  Updated Vital Signs BP (!) 149/78 (BP Location: Right Arm)   Pulse 76   Temp (!) 97.5 F (36.4 C) (Oral)   Resp 18   Ht 5' 9 (1.753 m)   Wt 240 lb 1.3 oz (108.9 kg)   SpO2 95%   BMI 35.45 kg/m   Visual Acuity Right Eye Distance:   Left Eye Distance:   Bilateral Distance:    Right Eye Near:   Left Eye Near:  Bilateral Near:     Physical Exam Vitals reviewed.  Constitutional:      General: He is awake. He is not in acute distress.    Appearance: Normal appearance. He is well-developed and well-groomed. He is not ill-appearing, toxic-appearing or diaphoretic.  HENT:     Head: Normocephalic.     Jaw: There is normal jaw occlusion. No tenderness or pain on movement.     Right Ear: Hearing, tympanic membrane, ear canal and external ear normal. No drainage, swelling or tenderness. No mastoid tenderness.     Left Ear: Hearing, tympanic membrane, ear canal and external ear normal. No drainage, swelling or tenderness. No mastoid tenderness.     Nose: Nose normal.     Mouth/Throat:     Mouth: Mucous membranes are moist.     Pharynx: Oropharynx is clear. Uvula midline.  Eyes:     General: Vision grossly intact.     Conjunctiva/sclera: Conjunctivae normal.  Cardiovascular:     Rate and Rhythm: Normal rate and regular rhythm.     Heart sounds: Normal heart sounds.  Pulmonary:     Effort: Pulmonary effort is normal.     Breath sounds: Normal breath sounds and air entry.  Musculoskeletal:        General: Normal range of motion.     Cervical back: Full passive range of motion without pain, normal range of motion and neck supple.  Skin:    General: Skin is warm and dry.  Neurological:     General: No focal deficit present.     Mental Status: He is alert and oriented to person, place, and time.  Psychiatric:        Speech: Speech normal.        Behavior: Behavior is cooperative.      UC Treatments / Results  Labs (all  labs ordered are listed, but only abnormal results are displayed) Labs Reviewed - No data to display  EKG   Radiology No results found.  Procedures Procedures (including critical care time)  Medications Ordered in UC Medications - No data to display  Initial Impression / Assessment and Plan / UC Course  I have reviewed the triage vital signs and the nursing notes.  Pertinent labs & imaging results that were available during my care of the patient were reviewed by me and considered in my medical decision making (see chart for details).     The patient reported swelling beneath the left earlobe that began Thursday night and resolved spontaneously by Friday. She described the swelling as significant but denies pain, difficulty swallowing, sore throat, shortness of breath, fever, recent illness, or lower extremity swelling. She takes amlodipine  for blood pressure management and was concerned about a possible reaction, but this type of localized, transient facial swelling is not a common side effect of the medication. The episode resolved without intervention, which is reassuring, though the exact cause remains unclear. She was advised to continue her current medications, monitor for recurrence, and keep her scheduled primary care appointment on September 18th for further evaluation. If swelling recurs and extends into the neck, or if throat itching or irritation develop, she should proceed to the emergency department immediately. She was also advised that she may contact her primary care provider sooner to request an earlier evaluation if symptoms return.  Today's evaluation has revealed no signs of a dangerous process. Discussed diagnosis with patient and/or guardian. Patient and/or guardian aware of their diagnosis, possible red flag symptoms to watch out  for and need for close follow up. Patient and/or guardian understands verbal and written discharge instructions. Patient and/or guardian  comfortable with plan and disposition.  Patient and/or guardian has a clear mental status at this time, good insight into illness (after discussion and teaching) and has clear judgment to make decisions regarding their care  Documentation was completed with the aid of voice recognition software. Transcription may contain typographical errors.  Final Clinical Impressions(s) / UC Diagnoses   Final diagnoses:  Right facial swelling     Discharge Instructions      You were evaluated today for swelling under your right earlobe that started Thursday night and resolved on its own by Friday. The swelling is not currently present, and your exam today is reassuring. This type of swelling is not a typical side effect of your blood pressure medication, amlodipine , so you should continue taking it as prescribed.  Since the swelling has resolved, no immediate treatment is needed. Monitor for any recurrence, and if it happens again you may try applying a cool compress to the area to help with discomfort. Keep your scheduled appointment with your primary care provider on September 18th for further evaluation, but you may also call sooner to request an earlier visit if the swelling returns.  Go to the emergency department immediately if the swelling recurs and spreads into your neck, or if you develop throat itching, irritation, difficulty swallowing, or shortness of breath. These could be signs of a more serious reaction requiring urgent care.      ED Prescriptions   None    PDMP not reviewed this encounter.   Iola Lukes, [place] 04/22/24 [phone]

## 2024-04-22 NOTE — ED Triage Notes (Signed)
 Swelling under my earlobe - Entered by patient

## 2024-04-22 NOTE — Discharge Instructions (Addendum)
 You were evaluated today for swelling under your right earlobe that started Thursday night and resolved on its own by Friday. The swelling is not currently present, and your exam today is reassuring. This type of swelling is not a typical side effect of your blood pressure medication, amlodipine , so you should continue taking it as prescribed.  Since the swelling has resolved, no immediate treatment is needed. Monitor for any recurrence, and if it happens again you may try applying a cool compress to the area to help with discomfort. Keep your scheduled appointment with your primary care provider on September 18th for further evaluation, but you may also call sooner to request an earlier visit if the swelling returns.  Go to the emergency department immediately if the swelling recurs and spreads into your neck, or if you develop throat itching, irritation, difficulty swallowing, or shortness of breath. These could be signs of a more serious reaction requiring urgent care.

## 2024-04-22 NOTE — ED Triage Notes (Signed)
 On Friday I noticed some swelling on the right side of my face, it did go down yesterday but I am concerned with it not improving.

## 2024-05-22 ENCOUNTER — Encounter: Payer: Self-pay | Admitting: Gastroenterology

## 2024-05-28 ENCOUNTER — Encounter: Payer: Self-pay | Admitting: Gastroenterology

## 2024-07-24 ENCOUNTER — Ambulatory Visit: Admitting: Gastroenterology

## 2024-07-24 ENCOUNTER — Encounter: Payer: Self-pay | Admitting: Gastroenterology

## 2024-07-24 VITALS — BP 142/80 | HR 62 | Ht 68.0 in | Wt 224.4 lb

## 2024-07-24 DIAGNOSIS — Z1211 Encounter for screening for malignant neoplasm of colon: Secondary | ICD-10-CM | POA: Diagnosis not present

## 2024-07-24 MED ORDER — PEG 3350-KCL-NABCB-NACL-NASULF 236 G PO SOLR: 4000.0000 mL | Freq: Once | ORAL | 0 refills | Status: AC

## 2024-07-24 NOTE — Patient Instructions (Addendum)
 Please bring extra colostomy supplies the day of your procedure.   You have been scheduled for a colonoscopy. Please follow written instructions given to you at your visit today.   If you use inhalers (even only as needed), please bring them with you on the day of your procedure.  DO NOT TAKE 7 DAYS PRIOR TO TEST- Trulicity (dulaglutide) Ozempic, Wegovy (semaglutide) Mounjaro, Zepbound (tirzepatide) Bydureon Bcise (exanatide extended release)  DO NOT TAKE 1 DAY PRIOR TO YOUR TEST Rybelsus (semaglutide) Adlyxin (lixisenatide) Victoza (liraglutide) Byetta (exanatide) ___________________________________________________________________________   Thank you for trusting me with your gastrointestinal care!    Dr. Victory Legrand DOUGLAS [facility]

## 2024-07-24 NOTE — Progress Notes (Signed)
 [facility] Consult Note:  History: Manuel Morales 07/24/2024  Referring provider: Deward Null, MD [facility] surgery)  Reason for consult/chief complaint: Follow-up (Pt states he was told to come fro his coloscopy bag, no concerns or problems at this moment )   Subjective  Prior history:  Seen as inpatient consultation April 2025 with left colonic obstruction.  Sigmoidoscopy revealed diverticular stricture, had sigmoid colectomy with descending end colostomy during that admission, and pathology specimen revealed diverticulosis without neoplasia. Last office visit with general surgery appears to have been shortly after that hospitalization.   Discussed the use of AI scribe software for clinical note transcription with the patient, who gave verbal consent to proceed.  History of Present Illness Manuel Morales is a 78 year old male with a history of bowel obstruction and colostomy who presents for evaluation prior to potential colostomy reversal surgery.  Colostomy status and postoperative course - Colostomy placed in April following surgical resection of a narrowed sigmoid colon segment due to diverticulitis and bowel obstruction. - No bleeding or other complications with the colostomy at this time. - No follow-up visits with the surgical team since shortly after hospitalization. - However,  Daniel says he contacted them recently after having been told to wait about 4 to 6 months after the initial surgery before colostomy takedown could be considered.  Upcoming appointment with the surgical center scheduled for December 5th to discuss potential colostomy reversal.   Colonoscopy and endoscopic evaluation - Last screening colonoscopy performed in 2014. - During April hospitalization, limited lower endoscopy performed due to high-grade obstruction; scope passed through narrowed area but full visualization was not possible due to inadequate bowel  preparation.   He reports the colostomy seems to be working well and he sees no blood from it.  He said no discharge from the rectal pouch.  Denies nausea vomiting dysphagia or loss of appetite.  ROS:  Review of Systems  Constitutional:  Negative for appetite change and unexpected weight change.  HENT:  Negative for mouth sores and voice change.   Eyes:  Negative for pain and redness.  Respiratory:  Positive for cough. Negative for shortness of breath.   Cardiovascular:  Negative for chest pain and palpitations.  Genitourinary:  Negative for dysuria and hematuria.  Musculoskeletal:  Negative for arthralgias and myalgias.  Skin:  Negative for pallor and rash.  Neurological:  Negative for weakness and headaches.  Hematological:  Negative for adenopathy.     Past Medical History: Past Medical History:  Diagnosis Date   Arthritis    Diverticulosis    Hypertension      Past Surgical History: Past Surgical History:  Procedure Laterality Date   BIOPSY OF SKIN SUBCUTANEOUS TISSUE AND/OR MUCOUS MEMBRANE  12/16/2023   Procedure: BIOPSY, SKIN, SUBCUTANEOUS TISSUE, OR MUCOUS MEMBRANE;  Surgeon: Legrand Victory LITTIE DOUGLAS, MD;  Location: [facility];  Service: Gastroenterology;;   COLONOSCOPY  ?1999   COLOSTOMY  12/19/2023   Procedure: CREATION, COLOSTOMY;  Surgeon: Null Deward DOUGLAS, MD;  Location[facility] OR;  Service: General;;   FLEXIBLE SIGMOIDOSCOPY N/A 12/16/2023   Procedure: KINGSTON SIDE;  Surgeon: Legrand Victory LITTIE DOUGLAS, MD;  Location: [facility];  Service: Gastroenterology;  Laterality: N/A;   LAPAROSCOPIC SIGMOID COLECTOMY N/A 12/19/2023   Procedure: COLECTOMY, SIGMOID, LAPAROSCOPIC;  Surgeon: Null Deward DOUGLAS, MD;  Location: [facility] OR;  Service: General;  Laterality: N/A;  POSSIBLE COLOSTOMY     Family History: Family History  Problem Relation Age of Onset   Dementia  Mother    Colon polyps Brother    Colon cancer Neg Hx    Esophageal cancer Neg Hx    Rectal cancer Neg Hx     Stomach cancer Neg Hx     Social History: Social History   Socioeconomic History   Marital status: Single    Spouse name: Not on file   Number of children: Not on file   Years of education: Not on file   Highest education level: Not on file  Occupational History   Not on file  Tobacco Use   Smoking status: Every Day    Current packs/day: 0.50    Average packs/day: 0.5 packs/day for 30.0 years (15.0 ttl pk-yrs)    Types: Cigarettes   Smokeless tobacco: Never  Vaping Use   Vaping status: Never Used  Substance and Sexual Activity   Alcohol use: No   Drug use: No   Sexual activity: Not Currently  Other Topics Concern   Not on file  Social History Narrative   Not on file   Social Drivers of Health   Financial Resource Strain: Not on file  Food Insecurity: No Food Insecurity (12/15/2023)   Hunger Vital Sign    Worried About Running Out of Food in the Last Year: Never true    Ran Out of Food in the Last Year: Never true  Transportation Needs: No Transportation Needs (12/15/2023)   PRAPARE - Administrator, Civil Service (Medical): No    Lack of Transportation (Non-Medical): No  Physical Activity: Not on file  Stress: Not on file  Social Connections: Moderately Isolated (12/15/2023)   Social Connection and Isolation Panel    Frequency of Communication with Friends and Family: Three times a week    Frequency of Social Gatherings with Friends and Family: Three times a week    Attends Religious Services: More than 4 times per year    Active Member of Clubs or Organizations: No    Attends Banker Meetings: Never    Marital Status: Divorced   He has cut down smoking but not stopped   Allergies: Allergies  Allergen Reactions   Penicillins Hives    Outpatient Meds: Current Outpatient Medications  Medication Sig Dispense Refill   amLODipine  (NORVASC ) 10 MG tablet Take 10 mg by mouth daily.     polyethylene glycol (GOLYTELY ) 236 g solution Take  4,000 mLs by mouth once for 1 dose. 4000 mL 0   XIIDRA 5 % SOLN Place 1 drop into both eyes 2 (two) times daily.     No current facility-administered medications for this visit.      ___________________________________________________________________ Objective   Exam:  BP (!) 142/80   Pulse 62   Ht 5' 8 (1.727 m)   Wt 224 lb 6 oz (101.8 kg)   BMI 34.12 kg/m  Wt Readings from Last 3 Encounters:  07/24/24 224 lb 6 oz (101.8 kg)  04/22/24 240 lb 1.3 oz (108.9 kg)  12/16/23 240 lb 1.3 oz (108.9 kg)    General: Well-appearing Eyes: sclera anicteric, no redness ENT: oral mucosa moist without lesions, no cervical or supraclavicular lymphadenopathy CV: Regular without appreciable murmur, no JVD, no peripheral edema Resp: clear to auscultation bilaterally, normal RR and effort noted GI: soft, left-sided colostomy bag.  The colostomy itself is partially visualized and looks healthy., no tenderness, with active bowel sounds. No guarding or palpable organomegaly noted. Skin; warm and dry, no rash or jaundice noted Neuro: awake, alert and oriented  x 3. Normal gross motor function and fluent speech   Data:  A. SIGMOID COLON, COLECTOMY (16 CM):  Diverticulosis  Diverticulum present in proximal margin  Proximal and distal margins viable and without inflammation    Encounter Diagnosis  Name Primary?   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 No complications. Colostomy functioning well.  Planned evaluation for colostomy reversal Updated screening colonoscopy required to assess remaining colon for polyps or tumors before reversal surgery. He was agreeable after discussion of colonoscopy risks and benefits  The benefits and risks of the planned procedure(s) were described in detail with the patient or (when appropriate)  their health care proxy.  Risks were outlined as including, but not limited to, bleeding, infection, perforation, adverse medication reaction leading to cardiac or pulmonary decompensation, pancreatitis (if ERCP).  The limitation of incomplete mucosal visualization was also discussed.  No guarantees or warranties were given.  GoLytely  prep and 1 fleets enema the morning of procedure (after completing the morning oral prep dose)  I suggest that he move his surgery office visit to a later date after the colonoscopy is complete.   Thank you for the courtesy of this consult.  Please call me with any questions or concerns.  Victory LITTIE Brand III  CC: Referring provider noted above

## 2024-08-24 ENCOUNTER — Encounter: Payer: Self-pay | Admitting: Gastroenterology

## 2024-08-24 ENCOUNTER — Ambulatory Visit (AMBULATORY_SURGERY_CENTER): Admitting: Gastroenterology

## 2024-08-24 VITALS — BP 127/76 | HR 82 | Temp 98.3°F | Resp 17 | Ht 68.0 in | Wt 224.0 lb

## 2024-08-24 DIAGNOSIS — D123 Benign neoplasm of transverse colon: Secondary | ICD-10-CM

## 2024-08-24 DIAGNOSIS — Z1211 Encounter for screening for malignant neoplasm of colon: Secondary | ICD-10-CM

## 2024-08-24 MED ORDER — SODIUM CHLORIDE 0.9 % IV SOLN: 500.0000 mL | Freq: Once | INTRAVENOUS | Status: DC

## 2024-08-24 NOTE — Progress Notes (Signed)
 History and Physical:  This patient presents for endoscopic testing for: Encounter Diagnosis  Name Primary?   Screening for colon cancer Yes    78 year old man here today for screening colonoscopy through a left-sided colostomy for preop evaluation before a planned colostomy takedown.  Last colonoscopy was in 2014.  This patient had an incomplete colonoscopy by me during hospitalization in April 2025 due to a diverticular stricture.  Further clinical details are in my 07/24/2024 office note.  Patient is otherwise without complaints or active issues today.   Past Medical History: Past Medical History:  Diagnosis Date   Arthritis    Diverticulosis    Hypertension      Past Surgical History: Past Surgical History:  Procedure Laterality Date   BIOPSY OF SKIN SUBCUTANEOUS TISSUE AND/OR MUCOUS MEMBRANE  12/16/2023   Procedure: BIOPSY, SKIN, SUBCUTANEOUS TISSUE, OR MUCOUS MEMBRANE;  Surgeon: Legrand Victory LITTIE DOUGLAS, MD;  Location: [facility];  Service: Gastroenterology;;   COLONOSCOPY  ?1999   COLOSTOMY  12/19/2023   Procedure: CREATION, COLOSTOMY;  Surgeon: Curvin Deward DOUGLAS, MD;  Location: [facility] OR;  Service: General;;   FLEXIBLE SIGMOIDOSCOPY N/A 12/16/2023   Procedure: KINGSTON SIDE;  Surgeon: Legrand Victory LITTIE DOUGLAS, MD;  Location: [facility];  Service: Gastroenterology;  Laterality: N/A;   LAPAROSCOPIC SIGMOID COLECTOMY N/A 12/19/2023   Procedure: COLECTOMY, SIGMOID, LAPAROSCOPIC;  Surgeon: Curvin Deward DOUGLAS, MD;  Location: [facility] OR;  Service: General;  Laterality: N/A;  POSSIBLE COLOSTOMY    Allergies: Allergies[1]  Outpatient Meds: Current Outpatient Medications  Medication Sig Dispense Refill   amLODipine  (NORVASC ) 10 MG tablet Take 10 mg by mouth daily.     XIIDRA 5 % SOLN Place 1 drop into both eyes 2 (two) times daily.     Current Facility-Administered Medications  Medication Dose Route Frequency Provider Last Rate Last Admin   0.9 %  sodium chloride  infusion  500 mL  Intravenous Once Danis, Jerrica Thorman L III, MD          ___________________________________________________________________ Objective   Exam:  BP (!) 160/99   Pulse 76   Temp 98.3 F (36.8 C)   Ht 5' 8 (1.727 m)   Wt 224 lb (101.6 kg)   SpO2 99%   BMI 34.06 kg/m   CV: regular , S1/S2 Resp: clear to auscultation bilaterally, normal RR and effort noted GI: soft, no tenderness, with active bowel sounds.left-sided colostomy   Assessment: Encounter Diagnosis  Name Primary?   Screening for colon cancer Yes     Plan: Colonoscopy   The benefits and risks of the planned procedure(s) were described in detail with the patient or (when appropriate) their health care proxy.  Risks were outlined as including, but not limited to, bleeding, infection, perforation, adverse medication reaction leading to cardiac or pulmonary decompensation, pancreatitis (if ERCP).  The limitation of incomplete mucosal visualization was also discussed.  No guarantees or warranties were given.  The patient was provided an opportunity to ask questions and all were answered. The patient agreed with the plan.   The patient is appropriate for an endoscopic procedure in the ambulatory setting.   - Victory Legrand, MD        [1]  Allergies Allergen Reactions   Penicillins Hives

## 2024-08-24 NOTE — Op Note (Signed)
  [facility] Patient Name: Manuel Morales Procedure Date: 08/24/2024 3:31 PM MRN: [id number] Endoscopist: Victory L. Legrand , MD, [id number] Age: 78 Referring MD:  Date of Birth: Dec 28, 1945 Gender: Male Account #: [id number] Procedure:                Colonoscopy Indications:              Screening for colorectal malignant neoplasm                           Last colonoscopy in 2014                           Sigmoidoscopy/incomplete colonoscopy during                            hospitalization April 2025 for severe diverticular                            stricture requiring sigmoid colectomy and                            descending end colostomy with Hartmann pouch. (Fair                            prep on that exam) Medicines:                Monitored Anesthesia Care Procedure:                Pre-Anesthesia Assessment:                           - Prior to the procedure, a History and Physical                            was performed, and patient medications and                            allergies were reviewed. [name] tolerance of                            previous anesthesia was also reviewed. The risks                            and benefits of the procedure and the sedation                            options and risks were discussed with the patient.                            All questions were answered, and informed consent                            was obtained. Prior Anticoagulants: The patient has                            taken no  anticoagulant or antiplatelet agents. ASA                            Grade Assessment: III - A patient with severe                            systemic disease. After reviewing the risks and                            benefits, the patient was deemed in satisfactory                            condition to undergo the procedure.                           After obtaining informed consent, the colonoscope                             was passed under direct vision. Throughout the                            procedure, the patient's blood pressure, pulse, and                            oxygen saturations were monitored continuously. The                            CF [id number] #[id number] was introduced through the                            descending colostomy and advanced to the the cecum,                            identified by appendiceal orifice and ileocecal                            valve. The colonoscopy was performed without                            difficulty. The patient tolerated the procedure                            well. The quality of the bowel preparation was                            good. The ileocecal valve and the appendiceal                            orifice as well as the ostomy were photographed. Scope In: 3:44:29 PM Scope Out: 4:01:04 PM Scope Withdrawal Time: 0 hours 14 minutes 37 seconds  Total Procedure Duration: 0 hours 16 minutes 35 seconds  Findings:                 Two sessile polyps were found in the transverse  colon. The polyps were diminutive in size. These                            polyps were removed with a cold snare. Resection                            and retrieval were complete.                           Ostomy looks healthy and was traversed with an                            adult colonoscope.                           Plans were for endoscopic exam of the rectal pouch.                            However, rectal exam revealed copious formed stool,                            thus that area was not examined. (Unclear if enemas                            were taken as directed today)                           The exam was otherwise without abnormality. Complications:            No immediate complications. Estimated Blood Loss:     Estimated blood loss was minimal. Impression:               - Two diminutive polyps in the transverse colon,                             removed with a cold snare. Resected and retrieved.                           - The examination was otherwise normal. Recommendation:           - Patient has a contact number available for                            emergencies. The signs and symptoms of potential                            delayed complications were discussed with the                            patient. Return to normal activities tomorrow.                            Written discharge instructions were provided to the  patient.                           - Resume previous diet.                           - Continue present medications.                           - Await pathology results.                           - No repeat routine polyp surveillance colonoscopy                            recommended due to age, current guidelines and                            today's findings.                           - Since the rectal pouch could not be examined                            today (see above), and while it was visualized                            during April 2025 inpatient sigmoidoscopy (albeit                            with fair prep quality), recommend surgery perform                            intraoperative proctoscopy after reanastomosis to                            ensure no significant findings. Refer patient back                            to us  if needed. Suprina Mandeville L. Legrand, MD 08/24/2024 4:15:46 PM This report has been signed electronically.

## 2024-08-24 NOTE — Progress Notes (Signed)
 Sedate, gd SR, tolerated procedure well, VSS, report to RN

## 2024-08-24 NOTE — Patient Instructions (Signed)
 Resume previous diet and medications. Awaiting pathology results. No repeat routine polyp surveillance colonoscopy recommended due to age, current guidelines and today's finding. Since the rectal pouch could not be examined today, and while it was visualized during April 2025 inpatient sigmoidoscopy recommend surgery perform intraoperative proctoscopy after reanastomosis to ensure no significant findings. Refer patient back to us  if needed.  YOU HAD AN ENDOSCOPIC PROCEDURE TODAY AT THE [facility]:   Refer to the procedure report that was given to you for any specific questions about what was found during the examination.  If the procedure report does not answer your questions, please call your gastroenterologist to clarify.  If you requested that your care partner not be given the details of your procedure findings, then the procedure report has been included in a sealed envelope for you to review at your convenience later.  YOU SHOULD EXPECT: Some feelings of bloating in the abdomen. Passage of more gas than usual.  Walking can help get rid of the air that was put into your GI tract during the procedure and reduce the bloating. If you had a lower endoscopy (such as a colonoscopy or flexible sigmoidoscopy) you may notice spotting of blood in your stool or on the toilet paper. If you underwent a bowel prep for your procedure, you may not have a normal bowel movement for a few days.  Please Note:  You might notice some irritation and congestion in your nose or some drainage.  This is from the oxygen used during your procedure.  There is no need for concern and it should clear up in a day or so.  SYMPTOMS TO REPORT IMMEDIATELY:  Following lower endoscopy (colonoscopy or flexible sigmoidoscopy):  Excessive amounts of blood in the stool  Significant tenderness or worsening of abdominal pains  Swelling of the abdomen that is new, acute  Fever of 100F or higher   For urgent or emergent  issues, a gastroenterologist can be reached at any hour by calling [phone]. Do not use MyChart messaging for urgent concerns.    DIET:  We do recommend a small meal at first, but then you may proceed to your regular diet.  Drink plenty of fluids but you should avoid alcoholic beverages for 24 hours.  ACTIVITY:  You should plan to take it easy for the rest of today and you should NOT DRIVE or use heavy machinery until tomorrow (because of the sedation medicines used during the test).    FOLLOW UP: Our staff will call the number listed on your records the next business day following your procedure.  We will call around 7:15- 8:00 am to check on you and address any questions or concerns that you may have regarding the information given to you following your procedure. If we do not reach you, we will leave a message.     If any biopsies were taken you will be contacted by phone or by letter within the next 1-3 weeks.  Please call us  at [phone] if you have not heard about the biopsies in 3 weeks.    SIGNATURES/CONFIDENTIALITY: You and/or your care partner have signed paperwork which will be entered into your electronic medical record.  These signatures attest to the fact that that the information above on your After Visit Summary has been reviewed and is understood.  Full responsibility of the confidentiality of this discharge information lies with you and/or your care-partner.

## 2024-08-24 NOTE — Progress Notes (Signed)
 Pt's states no medical or surgical changes since previsit or office visit.

## 2024-08-24 NOTE — Progress Notes (Signed)
 Called to room to assist during endoscopic procedure.  Patient ID and intended procedure confirmed with present staff. Received instructions for my participation in the procedure from the performing physician.

## 2024-08-27 ENCOUNTER — Telehealth: Payer: Self-pay

## 2024-08-27 NOTE — Telephone Encounter (Signed)
 Post procedure follow up call, no answer

## 2024-09-03 ENCOUNTER — Telehealth: Payer: Self-pay

## 2024-09-03 LAB — SURGICAL PATHOLOGY

## 2024-09-03 NOTE — Telephone Encounter (Signed)
 Colonoscopy results faxed to [facility] for further evaluation. Pt has an upcoming appt with [facility] on 09/17/24.

## 2024-09-05 ENCOUNTER — Ambulatory Visit: Payer: Self-pay | Admitting: Gastroenterology
